# Patient Record
Sex: Female | Born: 1985
Health system: Southern US, Community
[De-identification: ages and names within clinical notes are randomized; demographics above are authoritative.]

## PROBLEM LIST (undated history)

## (undated) ENCOUNTER — Inpatient Hospital Stay (HOSPITAL_COMMUNITY): Payer: Self-pay

## (undated) ENCOUNTER — Emergency Department (HOSPITAL_COMMUNITY): Admission: EM | Payer: 59 | Source: Home / Self Care

## (undated) DIAGNOSIS — E0500 Thyrotoxicosis with diffuse goiter without thyrotoxic crisis or storm: Secondary | ICD-10-CM

## (undated) DIAGNOSIS — E079 Disorder of thyroid, unspecified: Secondary | ICD-10-CM

## (undated) DIAGNOSIS — O1490 Unspecified pre-eclampsia, unspecified trimester: Secondary | ICD-10-CM

## (undated) DIAGNOSIS — J301 Allergic rhinitis due to pollen: Secondary | ICD-10-CM

## (undated) DIAGNOSIS — D649 Anemia, unspecified: Secondary | ICD-10-CM

## (undated) HISTORY — PX: THYROIDECTOMY: SHX17

## (undated) HISTORY — DX: Unspecified pre-eclampsia, unspecified trimester: O14.90

## (undated) HISTORY — DX: Anemia, unspecified: D64.9

## (undated) HISTORY — PX: THERAPEUTIC ABORTION: SHX798

---

## 2011-02-26 DIAGNOSIS — E039 Hypothyroidism, unspecified: Secondary | ICD-10-CM | POA: Insufficient documentation

## 2011-04-23 DIAGNOSIS — L689 Hypertrichosis, unspecified: Secondary | ICD-10-CM | POA: Insufficient documentation

## 2015-01-01 ENCOUNTER — Encounter (HOSPITAL_BASED_OUTPATIENT_CLINIC_OR_DEPARTMENT_OTHER): Payer: Self-pay

## 2015-01-01 ENCOUNTER — Emergency Department (HOSPITAL_BASED_OUTPATIENT_CLINIC_OR_DEPARTMENT_OTHER)
Admission: EM | Admit: 2015-01-01 | Discharge: 2015-01-01 | Disposition: A | Discharge status: Home / Self Care | Payer: Commercial Managed Care - HMO | Attending: Emergency Medicine | Admitting: Emergency Medicine

## 2015-01-01 DIAGNOSIS — Z3202 Encounter for pregnancy test, result negative: Secondary | ICD-10-CM | POA: Diagnosis not present

## 2015-01-01 DIAGNOSIS — R197 Diarrhea, unspecified: Secondary | ICD-10-CM | POA: Diagnosis present

## 2015-01-01 DIAGNOSIS — Z79899 Other long term (current) drug therapy: Secondary | ICD-10-CM | POA: Diagnosis not present

## 2015-01-01 DIAGNOSIS — E079 Disorder of thyroid, unspecified: Secondary | ICD-10-CM | POA: Insufficient documentation

## 2015-01-01 DIAGNOSIS — N3090 Cystitis, unspecified without hematuria: Secondary | ICD-10-CM

## 2015-01-01 HISTORY — DX: Disorder of thyroid, unspecified: E07.9

## 2015-01-01 LAB — URINALYSIS, ROUTINE W REFLEX MICROSCOPIC
Bilirubin Urine: NEGATIVE
Glucose, UA: NEGATIVE mg/dL
Hgb urine dipstick: NEGATIVE
KETONES UR: NEGATIVE mg/dL
Nitrite: NEGATIVE
PROTEIN: NEGATIVE mg/dL
Specific Gravity, Urine: 1.005 (ref 1.005–1.030)
UROBILINOGEN UA: 1 mg/dL (ref 0.0–1.0)
pH: 6.5 (ref 5.0–8.0)

## 2015-01-01 LAB — PREGNANCY, URINE: PREG TEST UR: NEGATIVE

## 2015-01-01 LAB — URINE MICROSCOPIC-ADD ON

## 2015-01-01 MED ORDER — NITROFURANTOIN MONOHYD MACRO 100 MG PO CAPS: 100.0000 mg | ORAL_CAPSULE | Freq: Two times a day (BID) | ORAL | Status: DC

## 2015-01-01 NOTE — ED Notes (Signed)
Pt presents eating and drinking in triage.

## 2015-01-01 NOTE — Discharge Instructions (Signed)
Diarrhea °Diarrhea is frequent loose and watery bowel movements. It can cause you to feel weak and dehydrated. Dehydration can cause you to become tired and thirsty, have a dry mouth, and have decreased urination that often is dark yellow. Diarrhea is a sign of another problem, most often an infection that will not last long. In most cases, diarrhea typically lasts 2-3 days. However, it can last longer if it is a sign of something more serious. It is important to treat your diarrhea as directed by your caregiver to lessen or prevent future episodes of diarrhea. °CAUSES  °Some common causes include: °· Gastrointestinal infections caused by viruses, bacteria, or parasites. °· Food poisoning or food allergies. °· Certain medicines, such as antibiotics, chemotherapy, and laxatives. °· Artificial sweeteners and fructose. °· Digestive disorders. °HOME CARE INSTRUCTIONS °· Ensure adequate fluid intake (hydration): Have 1 cup (8 oz) of fluid for each diarrhea episode. Avoid fluids that contain simple sugars or sports drinks, fruit juices, whole milk products, and sodas. Your urine should be clear or pale yellow if you are drinking enough fluids. Hydrate with an oral rehydration solution that you can purchase at pharmacies, retail stores, and online. You can prepare an oral rehydration solution at home by mixing the following ingredients together: °¨  - tsp table salt. °¨ ¾ tsp baking soda. °¨  tsp salt substitute containing potassium chloride. °¨ 1  tablespoons sugar. °¨ 1 L (34 oz) of water. °· Certain foods and beverages may increase the speed at which food moves through the gastrointestinal (GI) tract. These foods and beverages should be avoided and include: °¨ Caffeinated and alcoholic beverages. °¨ High-fiber foods, such as raw fruits and vegetables, nuts, seeds, and whole grain breads and cereals. °¨ Foods and beverages sweetened with sugar alcohols, such as xylitol, sorbitol, and mannitol. °· Some foods may be well  tolerated and may help thicken stool including: °¨ Starchy foods, such as rice, toast, pasta, low-sugar cereal, oatmeal, grits, baked potatoes, crackers, and bagels. °¨ Bananas. °¨ Applesauce. °· Add probiotic-rich foods to help increase healthy bacteria in the GI tract, such as yogurt and fermented milk products. °· Wash your hands well after each diarrhea episode. °· Only take over-the-counter or prescription medicines as directed by your caregiver. °· Take a warm bath to relieve any burning or pain from frequent diarrhea episodes. °SEEK IMMEDIATE MEDICAL CARE IF:  °· You are unable to keep fluids down. °· You have persistent vomiting. °· You have blood in your stool, or your stools are black and tarry. °· You do not urinate in 6-8 hours, or there is only a small amount of very dark urine. °· You have abdominal pain that increases or localizes. °· You have weakness, dizziness, confusion, or light-headedness. °· You have a severe headache. °· Your diarrhea gets worse or does not get better. °· You have a fever or persistent symptoms for more than 2-3 days. °· You have a fever and your symptoms suddenly get worse. °MAKE SURE YOU:  °· Understand these instructions. °· Will watch your condition. °· Will get help right away if you are not doing well or get worse. °Document Released: 04/17/2002 Document Revised: 09/11/2013 Document Reviewed: 01/03/2012 °ExitCare® Patient Information ©2015 [employer]. This information is not intended to replace advice given to you by your health care provider. Make sure you discuss any questions you have with your health care provider. °Urinary Tract Infection °Urinary tract infections (UTIs) can develop anywhere along your urinary tract. Your urinary tract   is your body's drainage system for removing wastes and extra water. Your urinary tract includes two kidneys, two ureters, a bladder, and a urethra. Your kidneys are a pair of bean-shaped organs. Each kidney is about the size of  your fist. They are located below your ribs, one on each side of your spine. °CAUSES °Infections are caused by microbes, which are microscopic organisms, including fungi, viruses, and bacteria. These organisms are so small that they can only be seen through a microscope. Bacteria are the microbes that most commonly cause UTIs. °SYMPTOMS  °Symptoms of UTIs may vary by age and gender of the patient and by the location of the infection. Symptoms in young women typically include a frequent and intense urge to urinate and a painful, burning feeling in the bladder or urethra during urination. Older women and men are more likely to be tired, shaky, and weak and have muscle aches and abdominal pain. A fever may mean the infection is in your kidneys. Other symptoms of a kidney infection include pain in your back or sides below the ribs, nausea, and vomiting. °DIAGNOSIS °To diagnose a UTI, your caregiver will ask you about your symptoms. Your caregiver also will ask to provide a urine sample. The urine sample will be tested for bacteria and white blood cells. White blood cells are made by your body to help fight infection. °TREATMENT  °Typically, UTIs can be treated with medication. Because most UTIs are caused by a bacterial infection, they usually can be treated with the use of antibiotics. The choice of antibiotic and length of treatment depend on your symptoms and the type of bacteria causing your infection. °HOME CARE INSTRUCTIONS °· If you were prescribed antibiotics, take them exactly as your caregiver instructs you. Finish the medication even if you feel better after you have only taken some of the medication. °· Drink enough water and fluids to keep your urine clear or pale yellow. °· Avoid caffeine, tea, and carbonated beverages. They tend to irritate your bladder. °· Empty your bladder often. Avoid holding urine for long periods of time. °· Empty your bladder before and after sexual intercourse. °· After a bowel  movement, women should cleanse from front to back. Use each tissue only once. °SEEK MEDICAL CARE IF:  °· You have back pain. °· You develop a fever. °· Your symptoms do not begin to resolve within 3 days. °SEEK IMMEDIATE MEDICAL CARE IF:  °· You have severe back pain or lower abdominal pain. °· You develop chills. °· You have nausea or vomiting. °· You have continued burning or discomfort with urination. °MAKE SURE YOU:  °· Understand these instructions. °· Will watch your condition. °· Will get help right away if you are not doing well or get worse. °Document Released: 02/04/2005 Document Revised: 10/27/2011 Document Reviewed: 06/05/2011 °ExitCare® Patient Information ©2015 [employer]. This information is not intended to replace advice given to you by your health care provider. Make sure you discuss any questions you have with your health care provider. ° °

## 2015-01-01 NOTE — ED Notes (Signed)
Pt presents with numerous complaints: 1. Diarrhea since Friday with 2-3 episodes per day. 2. Nipple soreness. 3. Neck and back tenderness. 4. Intermittent fever with chills. 5. Dark colored urine.

## 2015-01-02 NOTE — ED Provider Notes (Signed)
CSN: [id number]     Arrival date & time 01/01/15  1042 History   First MD Initiated Contact with Patient 01/01/15 1046     Chief Complaint  Patient presents with  . Diarrhea     (Consider location/radiation/quality/duration/timing/severity/associated sxs/prior Treatment) Patient is a 29 y.o. female presenting with diarrhea.  Diarrhea Quality:  Watery Severity:  Moderate Onset quality:  Gradual Duration:  4 days Timing:  Intermittent Progression:  Unchanged Relieved by:  Nothing Worsened by:  Nothing tried Associated symptoms: chills   Associated symptoms: no abdominal pain, no diaphoresis, no fever and no vomiting   Risk factors: no recent antibiotic use, no sick contacts and no suspicious food intake     Past Medical History  Diagnosis Date  . Thyroid disease    History reviewed. No pertinent past surgical history. No family history on file. Social History  Substance Use Topics  . Smoking status: Never Smoker   . Smokeless tobacco: None  . Alcohol Use: None   OB History    No data available     Review of Systems  Constitutional: Positive for chills. Negative for fever and diaphoresis.  Gastrointestinal: Positive for diarrhea. Negative for vomiting and abdominal pain.  All other systems reviewed and are negative.     Allergies  Review of patient's allergies indicates no known allergies.  Home Medications   Prior to Admission medications   Medication Sig Start Date End Date Taking? Authorizing Provider  levothyroxine (SYNTHROID, LEVOTHROID) 100 MCG tablet Take 100 mcg by mouth daily before breakfast.   Yes Historical Provider, MD  nitrofurantoin, macrocrystal-monohydrate, (MACROBID) 100 MG capsule Take 1 capsule (100 mg total) by mouth 2 (two) times daily. X 7 days 01/01/15   Mirian Mo, MD   BP 135/82 mmHg  Pulse 108  Temp(Src) 100.9 F (38.3 C) (Oral)  Resp 18  Ht 5' (1.524 m)  Wt 154 lb (69.854 kg)  BMI 30.08 kg/m2  SpO2 99%  LMP  12/19/2014 Physical Exam  Constitutional: She is oriented to person, place, and time. She appears well-developed and well-nourished.  HENT:  Head: Normocephalic and atraumatic.  Right Ear: External ear normal.  Left Ear: External ear normal.  Eyes: Conjunctivae and EOM are normal. Pupils are equal, round, and reactive to light.  Neck: Normal range of motion. Neck supple.  Cardiovascular: Normal rate, regular rhythm, normal heart sounds and intact distal pulses.   Pulmonary/Chest: Effort normal and breath sounds normal.  Abdominal: Soft. Bowel sounds are normal. There is no tenderness.  Musculoskeletal: Normal range of motion.  Neurological: She is alert and oriented to person, place, and time.  Skin: Skin is warm and dry.  Vitals reviewed.   ED Course  Procedures (including critical care time) Labs Review Labs Reviewed  URINALYSIS, ROUTINE W REFLEX MICROSCOPIC (NOT AT [facility]) - Abnormal; Notable for the following:    APPearance CLOUDY (*)    Leukocytes, UA MODERATE (*)    All other components within normal limits  URINE MICROSCOPIC-ADD ON - Abnormal; Notable for the following:    Bacteria, UA MANY (*)    All other components within normal limits  PREGNANCY, URINE    Imaging Review No results found. I have personally reviewed and evaluated these images and lab results as part of my medical decision-making.   EKG Interpretation None      MDM   Final diagnoses:  Cystitis  Diarrhea    29 y.o. female without pertinent PMH presents with diarrhea and dark urine over the  past 4 days. No fevers at home, however here febrile to 100.9. On arrival vital signs and physical exam as above. Physical exam benign. Workup revealed signs of urinary tract infection. Discharged home with Macrobid. No abdominal pain or upper GI symptoms.    I have reviewed all laboratory and imaging studies if ordered as above  1. Cystitis   2. Diarrhea         Mirian Mo, MD 01/02/15 [phone]

## 2015-01-04 ENCOUNTER — Ambulatory Visit: Payer: Self-pay | Admitting: Internal Medicine

## 2015-02-05 ENCOUNTER — Ambulatory Visit (INDEPENDENT_AMBULATORY_CARE_PROVIDER_SITE_OTHER): Payer: Commercial Managed Care - HMO | Admitting: Internal Medicine

## 2015-02-05 ENCOUNTER — Encounter: Payer: Self-pay | Admitting: Internal Medicine

## 2015-02-05 VITALS — BP 128/62 | HR 81 | Temp 98.4°F | Resp 12 | Ht 61.0 in | Wt 152.4 lb

## 2015-02-05 DIAGNOSIS — E890 Postprocedural hypothyroidism: Secondary | ICD-10-CM | POA: Diagnosis not present

## 2015-02-05 DIAGNOSIS — L680 Hirsutism: Secondary | ICD-10-CM

## 2015-02-05 DIAGNOSIS — E349 Endocrine disorder, unspecified: Secondary | ICD-10-CM

## 2015-02-05 DIAGNOSIS — R7989 Other specified abnormal findings of blood chemistry: Secondary | ICD-10-CM

## 2015-02-05 LAB — T4, FREE: FREE T4: 1.62 ng/dL — AB (ref 0.60–1.60)

## 2015-02-05 LAB — TSH: TSH: 0.3 u[IU]/mL — AB (ref 0.35–4.50)

## 2015-02-05 NOTE — Progress Notes (Signed)
Patient ID: Carol Kelley, female   DOB: April 22, 1986, 29 y.o.   MRN: [id number]   HPI  Carol Kelley is a 29 y.o.-year-old female, referred by Dr. Linda Hedges, for management of postsurgical hypothyroidism. She would also want [place] to address her high testosterone level and hirsutism.  Pt. has been dx with Graves ds at 29 y/o >> tried po tx's and then RAI tx >> did not work >> thyroidectomy at 29 y/o; initially on Synthroid is on Levothyroxine 175 >> 200 mcg (09/2014), taken: - fasting - with water - separated by 4h from b'fast  - no calcium, iron, PPIs, multivitamins   I reviewed pt's thyroid tests: 08/28/2014: TSH 26.18, free T4 0.91  Pt describes: - no weight gain - no fatigue - no cold intolerance - no depression - no constipation - no dry skin - no hair loss - + regular menses  Pt denies feeling nodules in neck, hoarseness, dysphagia/odynophagia, SOB with lying down.  She has no FH of thyroid disorders. No FH of thyroid cancer.  No h/o radiation tx to head or neck, other RAI tx. No recent use of iodine supplements.  No h/o clotting ds or BrCA in her family. Has migraines.  She c/o acne - chin - started in highschool. She c/o hirsutism: chin, lower abd. - started in highschool. She was on OCPs in the past >> does not remember formulation >> acne and hair were better controlled. No infertility. She had a pregnancy in 2011.   I reviewed last received by OB/GYN , drawn on 08/28/2014: -  Total testosterone 75 (10-70),  Free testosterone 6.9 (0.6-6.8), SHBG 88 (17-124) -  CMP normal -  DHEAS 152 (18-391)  ROS: Constitutional: no weight gain/loss, no fatigue, no subjective hyperthermia/hypothermia Eyes: no blurry vision, no xerophthalmia ENT: no sore throat, no nodules palpated in throat, no dysphagia/odynophagia, no hoarseness Cardiovascular: no CP/SOB/palpitations/leg swelling Respiratory: no cough/SOB Gastrointestinal: no N/V/D/C Musculoskeletal: no muscle/joint  aches Skin: no rashes, + excessive hair growth - chin, lower abd. Neurological: no tremors/numbness/tingling/dizziness, + HA Psychiatric: no depression/+ anxiety  Past Medical History  Diagnosis Date  . Thyroid disease    No past surgical history on file. Social History   Social History  . Marital Status: Single    Spouse Name: N/A  . Number of Children: 1   Occupational History  .  Research scientist (physical sciences)   Social History Main Topics  . Smoking status: Current Some Day Smoker  . Smokeless tobacco: Not on file  . Alcohol Use: 0.0 oz/week    0 Standard drinks or equivalent per week  . Drug Use: No   Current Outpatient Prescriptions on File Prior to Visit  Medication Sig Dispense Refill  . nitrofurantoin, macrocrystal-monohydrate, (MACROBID) 100 MG capsule Take 1 capsule (100 mg total) by mouth 2 (two) times daily. X 7 days (Patient not taking: Reported on 02/05/2015) 14 capsule 0   No current facility-administered medications on file prior to visit.   No Known Allergies   FH: - hypertension in mother  PE: BP 128/62 mmHg  Pulse 81  Temp(Src) 98.4 F (36.9 C) (Oral)  Resp 12  Ht $R'5\' 1"'ys$  (1.549 m)  Wt 152 lb 6.4 oz (69.128 kg)  BMI 28.81 kg/m2  SpO2 99% LMP 01/20/2015 Wt Readings from Last 3 Encounters:  02/05/15 152 lb 6.4 oz (69.128 kg)  01/01/15 154 lb (69.854 kg)   Constitutional: overweight, in NAD Eyes: PERRLA, EOMI, no exophthalmos ENT: moist mucous membranes, no  Neck Masses,  cervical scar healed, no cervical lymphadenopathy Cardiovascular: RRR, No MRG Respiratory: CTA B Gastrointestinal: abdomen soft, NT, ND, BS+ Musculoskeletal: no deformities, strength intact in all 4 Skin: moist, warm, no rashes; + acne and dark hair shafts on chin and also dark hair on linea alba Neurological: no tremor with outstretched hands, DTR normal in all 4  ASSESSMENT: 1. Hypothyroidism -  Postsurgical, after total thyroidectomy for Graves' disease  2.  Elevated  testosterone and hirsutism - ?  PCOS  PLAN:  1. Patient with long-standing hypothyroidism, on levothyroxine therapy. She has had fluctuating thyroid tests in the past, and she admits that these her likely caused by her missing levothyroxine doses. Since last check, she mentions that she took it every day. She appears euthyroid.  - continue LT4 200 mcg daily for now - We discussed about correct intake of levothyroxine, fasting, with water, separated by at least 30 minutes from breakfast, and separated by more than 4 hours from calcium, iron, multivitamins, acid reflux medications (PPIs). I strongly advised her to take it every day, and put the bottle on the nightstand to make sure that she doesn't skip. - will check thyroid tests today: TSH, free T4 - If these are abnormal, she will need to return in 6-8 weeks for repeat labs - If these are normal, I will see her back in  6 months  2.  Elevated testosterone and hirsutism -  Patient has  Regular menstrual cycles, however she complains of acne and hirsutism , which were present since high school, but exacerbated in the last year. No significant weight changes. -  I reviewed previous labs obtained by OB/GYN in 08/2014.   The free testosterone is mildly elevated.  The DHEAS is normal. -  I will recheck the testosterone today, along with other PCOS -related labs.  I advised her to come back for these in the third day of her menstrual cycle, as now she is close to ovulation. -  I discussedwith the patient about the fact that the PCOS is a misnomer, a patient does not necessarily have to have polycystic ovaries to be diagnosed with the disorder. This is of sum of several conditions, including:  weight gain  insulin resistance (and therefore a higher risk of developing diabetes later in life)  acne  hirsutism  irregular menstrual cycles  decreased fertility. - We also discussed about the fact that the treatment is usually targeted to addressing  the problem that concerns the patient the most: acne/hirsutism, weight gain, or fertility, but there is no single treatment for PCOS.  - The first-line therapy are oral contraceptives. If she is concerned with her weight, we can use metformin; if she is concerned about acne/hirsutism, we can add spironolactone; and if she is concerned about fertility, I could refer her to reproductive endocrinology for possible use of clomiphene. -  I did suggest to start back on oral contraception, since this is the best way to help with acne and hirsutism.  She is contemplating another pregnancy, but not right now.  I am a little worried about her migraines, but she denies that these are associated with aura. I will await the results of the hormonal tests and then sent Ortho-Cyclen to her pharmacy, but I did advise her to let me know if anything changes in the character of her migraines or if she has the more often. I also advised her to continue to follow-up with OB/GYN.  Orders Placed This Encounter  Procedures  . T4,  free  . TSH  . Follicle stimulating hormone  . Luteinizing hormone  . Prolactin  . Testosterone, Free, Total, SHBG  . Estradiol  . Beta HCG, Quant (tumor marker)  . 17-Hydroxyprogesterone   - time spent with the patient: 1 hour, of which >50% was spent in obtaining information about her  Hypothyroidism  And possible PCOS, reviewing her previous labs, evaluations, and treatments, counseling her about her condition (please see the discussed topics above), and developing a plan to further investigate it; she had a number of questions which I addressed.  Office Visit on 02/05/2015  Component Date Value Ref Range Status  . Free T4 02/05/2015 1.62* 0.60 - 1.60 ng/dL Final  . TSH 02/05/2015 0.30* 0.35 - 4.50 uIU/mL Final    The thyroid tests are abnormal, which would be consistent with the patient not taking a high dose of levothyroxine consistently. I will advise her to decrease the dose of  levothyroxine to 175 g daily. We'll need to recheck the thyroid tests in 6 weeks.  I will addend the rest of the results when they become available.

## 2015-02-05 NOTE — Patient Instructions (Addendum)
Please stop at the lab.  Please check the following website: [email or web address]  Please come back for labs in the 3rd day of your next menstrual cycle.  Take the thyroid hormone every day, with water, >30 minutes before breakfast, separated by >4 hours from acid reflux medications, calcium, iron, multivitamins.  Please return in 6 months with your sugar log.

## 2015-02-06 DIAGNOSIS — R7989 Other specified abnormal findings of blood chemistry: Secondary | ICD-10-CM | POA: Insufficient documentation

## 2015-02-06 DIAGNOSIS — L680 Hirsutism: Secondary | ICD-10-CM | POA: Insufficient documentation

## 2015-02-06 MED ORDER — LEVOTHYROXINE SODIUM 175 MCG PO TABS
175.0000 ug | ORAL_TABLET | Freq: Every day | ORAL | Status: DC
Start: 2015-02-06 — End: 2015-07-09

## 2015-07-09 ENCOUNTER — Other Ambulatory Visit: Payer: Self-pay | Admitting: Internal Medicine

## 2015-08-05 ENCOUNTER — Ambulatory Visit: Payer: Commercial Managed Care - HMO | Admitting: Internal Medicine

## 2015-10-07 ENCOUNTER — Other Ambulatory Visit: Payer: Self-pay | Admitting: Internal Medicine

## 2016-03-17 ENCOUNTER — Other Ambulatory Visit: Payer: Self-pay | Admitting: Internal Medicine

## 2016-03-20 ENCOUNTER — Ambulatory Visit: Payer: Commercial Managed Care - HMO | Admitting: Internal Medicine

## 2016-04-30 ENCOUNTER — Emergency Department (HOSPITAL_BASED_OUTPATIENT_CLINIC_OR_DEPARTMENT_OTHER): Payer: Commercial Managed Care - HMO

## 2016-04-30 ENCOUNTER — Emergency Department (HOSPITAL_BASED_OUTPATIENT_CLINIC_OR_DEPARTMENT_OTHER)
Admission: EM | Admit: 2016-04-30 | Discharge: 2016-04-30 | Disposition: A | Discharge status: Home / Self Care | Payer: Commercial Managed Care - HMO | Attending: Emergency Medicine | Admitting: Emergency Medicine

## 2016-04-30 ENCOUNTER — Encounter (HOSPITAL_BASED_OUTPATIENT_CLINIC_OR_DEPARTMENT_OTHER): Payer: Self-pay

## 2016-04-30 DIAGNOSIS — Z87891 Personal history of nicotine dependence: Secondary | ICD-10-CM | POA: Insufficient documentation

## 2016-04-30 DIAGNOSIS — R059 Cough, unspecified: Secondary | ICD-10-CM

## 2016-04-30 DIAGNOSIS — R05 Cough: Secondary | ICD-10-CM | POA: Insufficient documentation

## 2016-04-30 DIAGNOSIS — R61 Generalized hyperhidrosis: Secondary | ICD-10-CM | POA: Insufficient documentation

## 2016-04-30 HISTORY — DX: Thyrotoxicosis with diffuse goiter without thyrotoxic crisis or storm: E05.00

## 2016-04-30 MED ORDER — BENZONATATE 100 MG PO CAPS: 100.0000 mg | ORAL_CAPSULE | Freq: Three times a day (TID) | ORAL | 0 refills | Status: DC

## 2016-04-30 MED FILL — BENZONATATE 100 MG CAPSULE: 100 | 7 days supply | Qty: 21 | Fill #0

## 2016-04-30 NOTE — Discharge Instructions (Signed)
Take the prescribed medication as directed. Follow-up with your primary care doctor for any ongoing issues. Return to the ED for new or worsening symptoms.

## 2016-04-30 NOTE — ED Provider Notes (Signed)
[facility] Provider Note   CSN: [id number] Arrival date & time: 04/30/16  1313     History   Chief Complaint Chief Complaint  Patient presents with  . Cough    HPI Carol Kelley is a 30 y.o. female.  The history is provided by the patient and medical records.  Cough     30 year old female with history of thyroid disease, here with cough. Reports this began 2 days ago. Cough is productive with thick, yellow mucus. She reports associated chills and sweating at night. She's not had any known sick contacts. She denies any history of or exposure to TB. She is not been out of the country recently. She did try taking some over-the-counter Robitussin for cough but this gave her diarrhea. She denies any abdominal pain. No chest pain or shortness of breath.  Past Medical History:  Diagnosis Date  . Graves disease   . Thyroid disease     Patient Active Problem List   Diagnosis Date Noted  . Elevated testosterone level in female 02/06/2015  . Hirsutism 02/06/2015  . Postsurgical hypothyroidism 02/05/2015    Past Surgical History:  Procedure Laterality Date  . CESAREAN SECTION    . THYROIDECTOMY      OB History    No data available       Home Medications    Prior to Admission medications   Medication Sig Start Date End Date Taking? Authorizing Provider  levothyroxine (SYNTHROID, LEVOTHROID) 175 MCG tablet TAKE 1 TABLET (175 MCG TOTAL) BY MOUTH DAILY BEFORE BREAKFAST. **PT NEEDS LAB APPT** 03/17/16   Carlus Pavlovristina Gherghe, MD    Family History No family history on file.  Social History Social History  Substance Use Topics  . Smoking status: Former Games developermoker  . Smokeless tobacco: Never Used  . Alcohol use 0.0 oz/week     Comment: occ     Allergies   Patient has no known allergies.   Review of Systems Review of Systems  Constitutional: Positive for diaphoresis (nights).  Respiratory: Positive for cough.   All other systems reviewed and are  negative.    Physical Exam Updated Vital Signs BP 113/80 (BP Location: Right Leg)   Pulse 86   Temp 98.3 F (36.8 C) (Oral)   Resp 18   Ht 5' (1.524 m)   Wt 69.3 kg   LMP 04/29/2016   SpO2 100%   BMI 29.84 kg/m   Physical Exam  Constitutional: She is oriented to person, place, and time. She appears well-developed and well-nourished.  HENT:  Head: Normocephalic and atraumatic.  Right Ear: Hearing and ear canal normal.  Left Ear: Hearing and ear canal normal.  Nose: Nose normal.  Mouth/Throat: Uvula is midline, oropharynx is clear and moist and mucous membranes are normal.  Fluid in both ears, no signs of infection, TMs intact, mastoids non-tender, no significant nasal congestion, tonsils normal in appearance bilaterally,  Eyes: Conjunctivae and EOM are normal. Pupils are equal, round, and reactive to light.  Neck: Normal range of motion.  Cardiovascular: Normal rate, regular rhythm and normal heart sounds.   Pulmonary/Chest: Effort normal and breath sounds normal. She has no wheezes. She has no rhonchi.  Lungs clear, no wheezes or rhonchi  Abdominal: Soft. Bowel sounds are normal.  Musculoskeletal: Normal range of motion.  Neurological: She is alert and oriented to person, place, and time.  Skin: Skin is warm and dry.  Psychiatric: She has a normal mood and affect.  Nursing note and vitals reviewed.  ED Treatments / Results  Labs (all labs ordered are listed, but only abnormal results are displayed) Labs Reviewed - No data to display  EKG  EKG Interpretation None       Radiology Dg Chest 2 View  Result Date: 04/30/2016 CLINICAL DATA:  Productive cough and night sweats for 1 week. EXAM: CHEST  2 VIEW COMPARISON:  None. FINDINGS: The heart size and mediastinal contours are within normal limits. Both lungs are clear. The visualized skeletal structures are unremarkable. IMPRESSION: Negative.  No active cardiopulmonary disease. Electronically Signed   By: Myles RosenthalJohn   Stahl M.D.   On: 04/30/2016 13:50    Procedures Procedures (including critical care time)  Medications Ordered in ED Medications - No data to display   Initial Impression / Assessment and Plan / ED Course  I have reviewed the triage vital signs and the nursing notes.  Pertinent labs & imaging results that were available during my care of the patient were reviewed by me and considered in my medical decision making (see chart for details).  Clinical Course    30 year old female here with cough. Reports sweating at night. Here she is afebrile and nontoxic. Her exam is overall noninfectious. Chest x-ray obtained, no acute findings. No prior diagnosis or exposure to TB and I have low suspicion for such. Likely viral URI. Will be discharged home with symptomatic care.  Discussed plan with patient, she acknowledged understanding and agreed with plan of care.  Return precautions given for new or worsening symptoms.  Final Clinical Impressions(s) / ED Diagnoses   Final diagnoses:  Cough    New Prescriptions Discharge Medication List as of 04/30/2016  2:25 PM    START taking these medications   Details  benzonatate (TESSALON) 100 MG capsule Take 1 capsule (100 mg total) by mouth every 8 (eight) hours., Starting Thu 04/30/2016, Print         Garlon HatchetLisa M Jairus Tonne, PA-C 04/30/16 1513    Benjiman CoreNathan Pickering, MD 04/30/16 [phone]

## 2016-04-30 NOTE — ED Triage Notes (Signed)
Cough x 3-4 days-NAD-steady gait

## 2016-05-08 ENCOUNTER — Ambulatory Visit (INDEPENDENT_AMBULATORY_CARE_PROVIDER_SITE_OTHER): Payer: Commercial Managed Care - HMO | Admitting: Internal Medicine

## 2016-05-08 VITALS — BP 120/76 | HR 66 | Temp 98.3°F | Resp 16 | Ht 60.0 in | Wt 155.2 lb

## 2016-05-08 DIAGNOSIS — E890 Postprocedural hypothyroidism: Secondary | ICD-10-CM | POA: Diagnosis not present

## 2016-05-08 DIAGNOSIS — R7989 Other specified abnormal findings of blood chemistry: Secondary | ICD-10-CM

## 2016-05-08 DIAGNOSIS — E349 Endocrine disorder, unspecified: Secondary | ICD-10-CM | POA: Diagnosis not present

## 2016-05-08 LAB — T4, FREE: FREE T4: 1.44 ng/dL (ref 0.60–1.60)

## 2016-05-08 LAB — TSH: TSH: 0.31 u[IU]/mL — ABNORMAL LOW (ref 0.35–4.50)

## 2016-05-08 NOTE — Progress Notes (Signed)
Patient ID: Carol Kelley, female   DOB: [birth year], 30 y.o.   MRN: [id number]   HPI  Carol Kelley is a 30 y.o.-year-old female, initially referred by Dr. Linda Hedges, for management of postsurgical hypothyroidism. She would also want [place] to address her high testosterone level and hirsutism. Last visit 01/2015, after which patient was lost for follow-up.  She tells me that she cannot easily take time off from work given to come for labs.  Postsurgical hypothyroidism: Reviewed and addended history: Pt. has been dx with Graves ds at 30 y/o >> tried po tx's and then RAI tx >> did not work >> thyroidectomy at 30 y/o; initially on Synthroid is on Levothyroxine 175 >> 200 >> 175 mcg (01/2015).  She is taking the levothyroxine: - fasting - with water - separated by 30 from b'fast  - no calcium, iron, PPIs - multivitamins at lunchtime - started 6 mo ago  I reviewed pt's thyroid tests: Lab Results  Component Value Date   TSH 0.30 (L) 02/05/2015   FREET4 1.62 (H) 02/05/2015  08/28/2014: TSH 26.18, free T4 0.91  Pt describes: - no weight gain - no fatigue - no cold intolerance - no depression - no constipation - no dry skin - no hair loss - + regular menses  Pt denies feeling nodules in neck, hoarseness, dysphagia/odynophagia, SOB with lying down.  She has no FH of thyroid disorders. No FH of thyroid cancer.  No h/o radiation tx to head or neck, other RAI tx. No recent use of iodine supplements.  No h/o clotting ds or BrCA in her family. Has migraines.   She also had labs and a phenotype consistent with PCOS at last visit, and I advised her to return for a more complete investigation, but she did not return for this. - + acne on chin - started in highschool. - + hirsutism on chin, lower abd. - started in highschool. She was on OCPs in the past >> acne and hair were better controlled. However, she would not want to be on OCPs anymore and would like to follow a more natural  approach. No infertility. She had a pregnancy in 2011.   Labs received by OB/GYN (08/28/2014) - Dr. Linda Hedges: -  Total testosterone 75 (10-70),  Free testosterone 6.9 (0.6-6.8), SHBG 88 (17-124) -  CMP normal -  DHEAS 152 (18-391)  She had a repeat testosterone in 10/2015 >> will get records.  She started MVI >> she feels much better. They also help with dysmenorrhea.  ROS: Constitutional: no weight gain/loss, no fatigue, no subjective hyperthermia/hypothermia Eyes: no blurry vision, no xerophthalmia ENT: no sore throat, no nodules palpated in throat, no dysphagia/odynophagia, no hoarseness Cardiovascular: no CP/SOB/palpitations/leg swelling Respiratory: + cough/no SOB Gastrointestinal: no N/V/D/C Musculoskeletal: no muscle/joint aches Skin: no rashes, + excessive hair growth - chin, lower abd. Neurological: no tremors/numbness/tingling/dizziness, + HA  I reviewed pt's medications, allergies, PMH, social hx, family hx, and changes were documented in the history of present illness. Otherwise, unchanged from my initial visit note.  Past Medical History:  Diagnosis Date  . Graves disease   . Thyroid disease    Past Surgical History:  Procedure Laterality Date  . CESAREAN SECTION    . THYROIDECTOMY     Social History   Social History  . Marital Status: Single    Spouse Name: N/A  . Number of Children: 1   Occupational History  .  Research scientist (physical sciences)   Social History Main Topics  .  Smoking status: Current Some Day Smoker  . Smokeless tobacco: Not on file  . Alcohol Use: 0.0 oz/week    0 Standard drinks or equivalent per week  . Drug Use: No   Current Outpatient Prescriptions on File Prior to Visit  Medication Sig Dispense Refill  . benzonatate (TESSALON) 100 MG capsule Take 1 capsule (100 mg total) by mouth every 8 (eight) hours. 21 capsule 0  . levothyroxine (SYNTHROID, LEVOTHROID) 175 MCG tablet TAKE 1 TABLET (175 MCG TOTAL) BY MOUTH DAILY BEFORE  BREAKFAST. **PT NEEDS LAB APPT** 45 tablet 1   No current facility-administered medications on file prior to visit.    No Known Allergies   FH: - hypertension in mother  PE: BP 120/76   Pulse 66   Temp 98.3 F (36.8 C)   Resp 16   Ht 5' (1.524 m)   Wt 155 lb 3.2 oz (70.4 kg)   LMP 04/29/2016   BMI 30.31 kg/m   Wt Readings from Last 3 Encounters:  05/08/16 155 lb 3.2 oz (70.4 kg)  04/30/16 152 lb 12.8 oz (69.3 kg)  02/05/15 152 lb 6.4 oz (69.1 kg)   Constitutional: overweight, in NAD Eyes: PERRLA, EOMI, no exophthalmos ENT: moist mucous membranes, no neck Masses, cervical scar healed, no cervical lymphadenopathy Cardiovascular: RRR, No MRG Respiratory: CTA B Gastrointestinal: abdomen soft, NT, ND, BS+ Musculoskeletal: no deformities, strength intact in all 4 Skin: moist, warm, no rashes; + acne and dark hair shafts on chin and also dark hair on linea alba Neurological: no tremor with outstretched hands, DTR normal in all 4  ASSESSMENT: 1. Postsurgical Hypothyroidism - after total thyroidectomy for Graves' disease  2.  Elevated testosterone and hirsutism - Most likely PCOS  PLAN:  1. Patient with long-standing hypothyroidism, on levothyroxine therapy. She has had fluctuating thyroid tests in the past, and she admitted for missing levothyroxine doses in the past, but none now. At last visit, her TSH was suppressed, and we decrease her levothyroxine from 200 to 175 g daily. She appears euthyroid.  - continue LT4 175 mcg daily for now, but I advised her that we will need to check labs in 5-6 weeks if we end up changing her current levothyroxine dose - We discussed about correct intake of levothyroxine, fasting, with water, separated by at least 30 minutes from breakfast, and separated by more than 4 hours from calcium, iron, multivitamins, acid reflux medications (PPIs). She is taking it correctly. - will check thyroid tests today: TSH, free T4 - If these are abnormal, she  will need to return in 6-8 weeks for repeat labs - If these are normal, I will see her back in one year, per her preference  2.  Elevated testosterone and hirsutism -  Patient has regular menstrual cycles, with more dysmenorrhea lately (improved on multivitamins), but does have acne and hirsutism, which were present since high school. No significant weight changes. At last visit, we discussed at length about PCOS syndrome, since this is highly probable for her, and I advised her to come back in the third day of her menstrual cycle for a more complete workup. She did not return and she mentions that this is almost impossible for her, since she cannot take time from work. She does tell me that her OB/GYN doctor, Dr. Lynnette Caffey, checked her testosterone again this summer and this was elevated. Patient signed a release of information form and will obtain these labs. Today is her 9th day since LMP, so I will  not check a testosterone level, but will check a prolactin and 17 hydroxyprogesterone. If these are normal, she most likely has PCOS. - We also discussed about the fact that the treatment is usually targeted to addressing the problem that concerns the patient the most, which is her: acne/hirsutism. These got better on oral contraceptives, however, she would not want to restart and prefers a more natural approach. We discussed about the new list studies with resveratrol, and antioxidant, which showed great results in terms of decreased testosterone levels and insulin resistance. She would like to try this >> recommended to start trans-resveratrol 1500 mg daily.  Needs refills.  Component     Latest Ref Rng & Units 05/08/2016  T4,Free(Direct)     0.60 - 1.60 ng/dL 1.44  TSH     0.35 - 4.50 uIU/mL 0.31 (L)  Prolactin     ng/mL 15.0  17-OH-Progesterone, LC/MS/MS      ng/dL 21   Prolactin and 17 hydroxyprogesterone levels are normal, therefore, the most likely diagnosis is PCOS. Since she would like to  avoid OCPs, will start trans-resveratrol.  TSH is still low, therefore, will decrease the levothyroxine dose to 150 g daily. We'll need a repeat set of labs in 2 months.  Philemon Kingdom, MD PhD [facility]

## 2016-05-08 NOTE — Patient Instructions (Addendum)
Please stop at the lab.  Please try Trans-Resveratrol 1500 mg daily daily.  Continue Levothyroxine 175 mcg daily.  Take the thyroid hormone every day, with water, at least 30 minutes before breakfast, separated by at least 4 hours from: - acid reflux medications - calcium - iron - multivitamins  Please return in 1 years.

## 2016-05-09 LAB — PROLACTIN: Prolactin: 15 ng/mL

## 2016-05-12 ENCOUNTER — Encounter: Payer: Self-pay | Admitting: Internal Medicine

## 2016-05-12 LAB — 17-HYDROXYPROGESTERONE: 17-OH-Progesterone, LC/MS/MS: 21 ng/dL

## 2016-05-12 MED ORDER — LEVOTHYROXINE SODIUM 150 MCG PO TABS: 150.0000 ug | ORAL_TABLET | Freq: Every day | ORAL | 1 refills | Status: DC

## 2016-05-13 ENCOUNTER — Telehealth: Payer: Self-pay

## 2016-05-13 NOTE — Telephone Encounter (Signed)
Left message for patient to call back for lab results. Gave call back number.

## 2016-05-13 NOTE — Telephone Encounter (Signed)
Patient returned phone call, discussed lab work and medication changes, and dosages. Patient made lab appointment for 2 months as requested.

## 2016-07-17 ENCOUNTER — Other Ambulatory Visit: Payer: Commercial Managed Care - HMO

## 2016-10-09 ENCOUNTER — Other Ambulatory Visit: Payer: Self-pay | Admitting: Internal Medicine

## 2017-03-16 ENCOUNTER — Other Ambulatory Visit: Payer: Self-pay

## 2017-03-16 MED ORDER — LEVOTHYROXINE SODIUM 150 MCG PO TABS: ORAL_TABLET | ORAL | 1 refills | Status: DC

## 2017-05-07 ENCOUNTER — Ambulatory Visit: Payer: Commercial Managed Care - HMO | Admitting: Internal Medicine

## 2017-09-11 ENCOUNTER — Other Ambulatory Visit: Payer: Self-pay | Admitting: Internal Medicine

## 2017-10-26 ENCOUNTER — Inpatient Hospital Stay (HOSPITAL_COMMUNITY)
Admission: AD | Admit: 2017-10-26 | Discharge: 2017-10-27 | Disposition: A | Discharge status: Home / Self Care | Payer: 59 | Source: Ambulatory Visit | Attending: Family Medicine | Admitting: Family Medicine

## 2017-10-26 ENCOUNTER — Other Ambulatory Visit: Payer: Self-pay

## 2017-10-26 ENCOUNTER — Encounter (HOSPITAL_COMMUNITY): Payer: Self-pay

## 2017-10-26 ENCOUNTER — Inpatient Hospital Stay (HOSPITAL_COMMUNITY): Payer: 59

## 2017-10-26 DIAGNOSIS — Z87891 Personal history of nicotine dependence: Secondary | ICD-10-CM | POA: Diagnosis not present

## 2017-10-26 DIAGNOSIS — R109 Unspecified abdominal pain: Secondary | ICD-10-CM

## 2017-10-26 DIAGNOSIS — B9689 Other specified bacterial agents as the cause of diseases classified elsewhere: Secondary | ICD-10-CM

## 2017-10-26 DIAGNOSIS — O26891 Other specified pregnancy related conditions, first trimester: Secondary | ICD-10-CM

## 2017-10-26 DIAGNOSIS — O2331 Infections of other parts of urinary tract in pregnancy, first trimester: Secondary | ICD-10-CM | POA: Diagnosis not present

## 2017-10-26 DIAGNOSIS — Z3A08 8 weeks gestation of pregnancy: Secondary | ICD-10-CM | POA: Diagnosis not present

## 2017-10-26 DIAGNOSIS — O209 Hemorrhage in early pregnancy, unspecified: Secondary | ICD-10-CM | POA: Diagnosis not present

## 2017-10-26 DIAGNOSIS — Z3491 Encounter for supervision of normal pregnancy, unspecified, first trimester: Secondary | ICD-10-CM

## 2017-10-26 DIAGNOSIS — N760 Acute vaginitis: Secondary | ICD-10-CM

## 2017-10-26 LAB — WET PREP, GENITAL
Sperm: NONE SEEN
Trich, Wet Prep: NONE SEEN
Yeast Wet Prep HPF POC: NONE SEEN

## 2017-10-26 LAB — URINALYSIS, ROUTINE W REFLEX MICROSCOPIC
BILIRUBIN URINE: NEGATIVE
Glucose, UA: NEGATIVE mg/dL
KETONES UR: NEGATIVE mg/dL
LEUKOCYTES UA: NEGATIVE
NITRITE: NEGATIVE
PROTEIN: NEGATIVE mg/dL
Specific Gravity, Urine: 1.011 (ref 1.005–1.030)
pH: 6 (ref 5.0–8.0)

## 2017-10-26 LAB — CBC
HCT: 29.5 % — ABNORMAL LOW (ref 36.0–46.0)
Hemoglobin: 10 g/dL — ABNORMAL LOW (ref 12.0–15.0)
MCH: 23.3 pg — AB (ref 26.0–34.0)
MCHC: 33.9 g/dL (ref 30.0–36.0)
MCV: 68.8 fL — ABNORMAL LOW (ref 78.0–100.0)
PLATELETS: 267 10*3/uL (ref 150–400)
RBC: 4.29 MIL/uL (ref 3.87–5.11)
RDW: 14.8 % (ref 11.5–15.5)
WBC: 6.7 10*3/uL (ref 4.0–10.5)

## 2017-10-26 LAB — HCG, QUANTITATIVE, PREGNANCY: HCG, BETA CHAIN, QUANT, S: 167285 m[IU]/mL — AB (ref <5)

## 2017-10-26 LAB — POCT PREGNANCY, URINE: Preg Test, Ur: POSITIVE — AB

## 2017-10-26 NOTE — MAU Note (Signed)
Pt states she is [redacted] weeks pregnant and has had lower abdominal cramping and spotting that started on Sunday. Pt reports and odor but denies discharge. LMP: 08/28/2017.

## 2017-10-26 NOTE — MAU Provider Note (Signed)
History     CSN: [id number]  Arrival date and time: 10/26/17 2137   First Provider Initiated Contact with Patient 10/26/17 2228      Chief Complaint  Patient presents with  . Vaginal Bleeding   HPI  Carol Kelley is a 32 y.o. G3P1011 at [redacted]w[redacted]d by LMP who presents with abdominal cramping and vaginal bleeding. Symptoms began on Sunday with intermittent lower abdominal cramping follow up vaginal bleeding. Rates pain 3/10. Has not treated symptoms. Nothing makes pain better or worse. Has noticed dark red/brown spotting on toilet paper and in her panty liner. Is not saturating pads or passing clots. No recent intercourse. Denies vaginal discharge or dysuria. States blood has strong odor to it.  Has not started prenatal care. Plans on going to [facility] but has not scheduled appointment with them.   OB History    Gravida  3   Para  1   Term  1   Preterm      AB  1   Living  1     SAB  1   TAB      Ectopic      Multiple      Live Births              Past Medical History:  Diagnosis Date  . Graves disease   . Thyroid disease     Past Surgical History:  Procedure Laterality Date  . CESAREAN SECTION    . THYROIDECTOMY      History reviewed. No pertinent family history.  Social History   Tobacco Use  . Smoking status: Former Games developer  . Smokeless tobacco: Never Used  Substance Use Topics  . Alcohol use: Yes    Alcohol/week: 0.0 oz    Comment: occ  . Drug use: Yes    Types: Marijuana    Comment: Last time 10/25/17    Allergies: No Known Allergies  Medications Prior to Admission  Medication Sig Dispense Refill Last Dose  . levothyroxine (SYNTHROID, LEVOTHROID) 150 MCG tablet TAKE 1 TABLET BY MOUTH EVERY DAY BEFORE BREAKFAST 60 tablet 0 10/26/2017 at Unknown time    Review of Systems  Constitutional: Negative.   Gastrointestinal: Positive for abdominal pain. Negative for nausea and vomiting.  Genitourinary: Positive for vaginal bleeding.  Negative for dysuria and vaginal discharge.   Physical Exam   Blood pressure 134/81, pulse 87, temperature 99.1 F (37.3 C), resp. rate 16, height 5' (1.524 m), weight 137 lb (62.1 kg), last menstrual period 08/28/2017, SpO2 100 %.  Physical Exam  Nursing note and vitals reviewed. Constitutional: She is oriented to person, place, and time. She appears well-developed and well-nourished. No distress.  HENT:  Head: Normocephalic and atraumatic.  Eyes: Conjunctivae are normal. Right eye exhibits no discharge. Left eye exhibits no discharge. No scleral icterus.  Neck: Normal range of motion.  Cardiovascular: Normal rate, regular rhythm and normal heart sounds.  No murmur heard. Respiratory: Effort normal and breath sounds normal. No respiratory distress. She has no wheezes.  GI: Soft. Bowel sounds are normal. She exhibits no distension. There is no tenderness. There is no rebound and no guarding.  Genitourinary: Uterus normal. Cervix exhibits no motion tenderness and no friability. Right adnexum displays no mass and no tenderness. Left adnexum displays no mass and no tenderness. There is bleeding (minimal amount of brown mucoid blood. No active bleeding. Foul odor noted. ) in the vagina. No vaginal discharge found.  Genitourinary Comments: Cervix closed  Neurological: She  is alert and oriented to person, place, and time.  Skin: Skin is warm and dry. She is not diaphoretic.  Psychiatric: She has a normal mood and affect. Her behavior is normal. Judgment and thought content normal.    MAU Course  Procedures Results for orders placed or performed during the hospital encounter of 10/26/17 (from the past 24 hour(s))  Urinalysis, Routine w reflex microscopic     Status: Abnormal   Collection Time: 10/26/17  9:55 PM  Result Value Ref Range   Color, Urine YELLOW YELLOW   APPearance CLEAR CLEAR   Specific Gravity, Urine 1.011 1.005 - 1.030   pH 6.0 5.0 - 8.0   Glucose, UA NEGATIVE NEGATIVE mg/dL    Hgb urine dipstick SMALL (A) NEGATIVE   Bilirubin Urine NEGATIVE NEGATIVE   Ketones, ur NEGATIVE NEGATIVE mg/dL   Protein, ur NEGATIVE NEGATIVE mg/dL   Nitrite NEGATIVE NEGATIVE   Leukocytes, UA NEGATIVE NEGATIVE   WBC, UA 0-5 0 - 5 WBC/hpf   Bacteria, UA RARE (A) NONE SEEN   Squamous Epithelial / LPF 0-5 0 - 5   Mucus PRESENT   Pregnancy, urine POC     Status: Abnormal   Collection Time: 10/26/17 10:03 PM  Result Value Ref Range   Preg Test, Ur POSITIVE (A) NEGATIVE  CBC     Status: Abnormal   Collection Time: 10/26/17 10:52 PM  Result Value Ref Range   WBC 6.7 4.0 - 10.5 K/uL   RBC 4.29 3.87 - 5.11 MIL/uL   Hemoglobin 10.0 (L) 12.0 - 15.0 g/dL   HCT 04.529.5 (L) 40.936.0 - 81.146.0 %   MCV 68.8 (L) 78.0 - 100.0 fL   MCH 23.3 (L) 26.0 - 34.0 pg   MCHC 33.9 30.0 - 36.0 g/dL   RDW 91.414.8 78.211.5 - 95.615.5 %   Platelets 267 150 - 400 K/uL  ABO/Rh     Status: None (Preliminary result)   Collection Time: 10/26/17 10:52 PM  Result Value Ref Range   ABO/RH(D)      O POS Performed at [facility], [address] [place], [place] [id number]   hCG, quantitative, pregnancy     Status: Abnormal   Collection Time: 10/26/17 10:52 PM  Result Value Ref Range   hCG, Beta Chain, Quant, S [phone] (H) <5 mIU/mL  Wet prep, genital     Status: Abnormal   Collection Time: 10/26/17 11:13 PM  Result Value Ref Range   Yeast Wet Prep HPF POC NONE SEEN NONE SEEN   Trich, Wet Prep NONE SEEN NONE SEEN   Clue Cells Wet Prep HPF POC PRESENT (A) NONE SEEN   WBC, Wet Prep HPF POC FEW (A) NONE SEEN   Sperm NONE SEEN    [place] Ob Comp Less 14 Wks  Result Date: 10/26/2017 CLINICAL DATA:  32 year old female with abdominal pain and vaginal bleeding in the 1st trimester of pregnancy. Positive urine pregnancy test, estimated gestational age by LMP 8 weeks 3 days. EXAM: OBSTETRIC <14 WK ULTRASOUND TECHNIQUE: Transabdominal ultrasound was performed for evaluation of the gestation as well as the maternal uterus and adnexal  regions. COMPARISON:  None. FINDINGS: Intrauterine gestational sac: Single Yolk sac:  Visible Embryo:  Visible Cardiac Activity: Detected Heart Rate: 164 bpm CRL:   18.6 mm   8 w 2 d                  US EDC: 06/05/2017 Subchorionic hemorrhage:  None visualized. Maternal uterus/adnexae: Normal left ovary measuring 2.4 x  2.6 x 1.6 centimeters. Normal right ovary measuring 2.5 x 2.5 x 1.6 centimeters. No pelvic free fluid. IMPRESSION: Single living IUP demonstrated. No acute maternal findings visualized. Electronically Signed   By: Odessa Fleming M.D.   On: 10/26/2017 23:58    MDM +UPT UA, wet prep, GC/chlamydia, CBC, ABO/Rh, quant hCG, HIV, and [place] today to rule out ectopic pregnancy O positive Ultrasound shows SIUP with cardiac activity Wet prep + clue cells Assessment and Plan  A: 1. Normal IUP (intrauterine pregnancy) on prenatal ultrasound, first trimester   2. Vaginal bleeding in pregnancy, first trimester   3. Abdominal pain during pregnancy in first trimester   4. BV (bacterial vaginosis)    p: Discharge home Rx flagyl GC/CT pending Start prenatal care Pelvic rest Discussed reasons to return to MAU  Judeth Horn 10/26/2017, 10:28 PM

## 2017-10-26 NOTE — MAU Note (Signed)
Pt. Reports spotting for two days and mild cramping. Denies discharge.

## 2017-10-27 LAB — ABO/RH: ABO/RH(D): O POS

## 2017-10-27 MED ORDER — METRONIDAZOLE 500 MG PO TABS: 500.0000 mg | ORAL_TABLET | Freq: Two times a day (BID) | ORAL | 0 refills | Status: DC

## 2017-10-27 NOTE — Discharge Instructions (Signed)
Vaginal Bleeding During Pregnancy, First Trimester A small amount of bleeding (spotting) from the vagina is relatively common in early pregnancy. It usually stops on its own. Various things may cause bleeding or spotting in early pregnancy. Some bleeding may be related to the pregnancy, and some may not. In most cases, the bleeding is normal and is not a problem. However, bleeding can also be a sign of something serious. Be sure to tell your health care provider about any vaginal bleeding right away. Some possible causes of vaginal bleeding during the first trimester include:  Infection or inflammation of the cervix.  Growths (polyps) on the cervix.  Miscarriage or threatened miscarriage.  Pregnancy tissue has developed outside of the uterus and in a fallopian tube (tubal pregnancy).  Tiny cysts have developed in the uterus instead of pregnancy tissue (molar pregnancy).  Follow these instructions at home: Watch your condition for any changes. The following actions may help to lessen any discomfort you are feeling:  Follow your health care provider's instructions for limiting your activity. If your health care provider orders bed rest, you may need to stay in bed and only get up to use the bathroom. However, your health care provider may allow you to continue light activity.  If needed, make plans for someone to help with your regular activities and responsibilities while you are on bed rest.  Keep track of the number of pads you use each day, how often you change pads, and how soaked (saturated) they are. Write this down.  Do not use tampons. Do not douche.  Do not have sexual intercourse or orgasms until approved by your health care provider.  If you pass any tissue from your vagina, save the tissue so you can show it to your health care provider.  Only take over-the-counter or prescription medicines as directed by your health care provider.  Do not take aspirin because it can make  you bleed.  Keep all follow-up appointments as directed by your health care provider.  Contact a health care provider if:  You have any vaginal bleeding during any part of your pregnancy.  You have cramps or labor pains.  You have a fever, not controlled by medicine. Get help right away if:  You have severe cramps in your back or belly (abdomen).  You pass large clots or tissue from your vagina.  Your bleeding increases.  You feel light-headed or weak, or you have fainting episodes.  You have chills.  You are leaking fluid or have a gush of fluid from your vagina.  You pass out while having a bowel movement. This information is not intended to replace advice given to you by your health care provider. Make sure you discuss any questions you have with your health care provider. Document Released: 02/04/2005 Document Revised: 10/03/2015 Document Reviewed: 01/02/2013 Elsevier Interactive Patient Education  2018 [employer]. Bacterial Vaginosis Bacterial vaginosis is a vaginal infection that occurs when the normal balance of bacteria in the vagina is disrupted. It results from an overgrowth of certain bacteria. This is the most common vaginal infection among women ages 72-44. Because bacterial vaginosis increases your risk for STIs (sexually transmitted infections), getting treated can help reduce your risk for chlamydia, gonorrhea, herpes, and HIV (human immunodeficiency virus). Treatment is also important for preventing complications in pregnant women, because this condition can cause an early (premature) delivery. What are the causes? This condition is caused by an increase in harmful bacteria that are normally present in small amounts  in the vagina. However, the reason that the condition develops is not fully understood. What increases the risk? The following factors may make you more likely to develop this condition:  Having a new sexual partner or multiple sexual  partners.  Having unprotected sex.  Douching.  Having an intrauterine device (IUD).  Smoking.  Drug and alcohol abuse.  Taking certain antibiotic medicines.  Being pregnant.  You cannot get bacterial vaginosis from toilet seats, bedding, swimming pools, or contact with objects around you. What are the signs or symptoms? Symptoms of this condition include:  Grey or white vaginal discharge. The discharge can also be watery or foamy.  A fish-like odor with discharge, especially after sexual intercourse or during menstruation.  Itching in and around the vagina.  Burning or pain with urination.  Some women with bacterial vaginosis have no signs or symptoms. How is this diagnosed? This condition is diagnosed based on:  Your medical history.  A physical exam of the vagina.  Testing a sample of vaginal fluid under a microscope to look for a large amount of bad bacteria or abnormal cells. Your health care provider may use a cotton swab or a small wooden spatula to collect the sample.  How is this treated? This condition is treated with antibiotics. These may be given as a pill, a vaginal cream, or a medicine that is put into the vagina (suppository). If the condition comes back after treatment, a second round of antibiotics may be needed. Follow these instructions at home: Medicines  Take over-the-counter and prescription medicines only as told by your health care provider.  Take or use your antibiotic as told by your health care provider. Do not stop taking or using the antibiotic even if you start to feel better. General instructions  If you have a female sexual partner, tell her that you have a vaginal infection. She should see her health care provider and be treated if she has symptoms. If you have a female sexual partner, he does not need treatment.  During treatment: ? Avoid sexual activity until you finish treatment. ? Do not douche. ? Avoid alcohol as directed by  your health care provider. ? Avoid breastfeeding as directed by your health care provider.  Drink enough water and fluids to keep your urine clear or pale yellow.  Keep the area around your vagina and rectum clean. ? Wash the area daily with warm water. ? Wipe yourself from front to back after using the toilet.  Keep all follow-up visits as told by your health care provider. This is important. How is this prevented?  Do not douche.  Wash the outside of your vagina with warm water only.  Use protection when having sex. This includes latex condoms and dental dams.  Limit how many sexual partners you have. To help prevent bacterial vaginosis, it is best to have sex with just one partner (monogamous).  Make sure you and your sexual partner are tested for STIs.  Wear cotton or cotton-lined underwear.  Avoid wearing tight pants and pantyhose, especially during summer.  Limit the amount of alcohol that you drink.  Do not use any products that contain nicotine or tobacco, such as cigarettes and e-cigarettes. If you need help quitting, ask your health care provider.  Do not use illegal drugs. Where to find more information:  Centers for Disease Control and Prevention: AppraiserFraud.fi  American Sexual Health Association (ASHA): www.ashastd.org  U.S. Department of Health and [employer], Office on Women's Health: DustingSprays.pl or  http://www.anderson-williamson.info/https://www.womenshealth.gov/a-z-topics/bacterial-vaginosis Contact a health care provider if:  Your symptoms do not improve, even after treatment.  You have more discharge or pain when urinating.  You have a fever.  You have pain in your abdomen.  You have pain during sex.  You have vaginal bleeding between periods. Summary  Bacterial vaginosis is a vaginal infection that occurs when the normal balance of bacteria in the vagina is disrupted.  Because bacterial vaginosis increases your risk for STIs (sexually transmitted infections),  getting treated can help reduce your risk for chlamydia, gonorrhea, herpes, and HIV (human immunodeficiency virus). Treatment is also important for preventing complications in pregnant women, because the condition can cause an early (premature) delivery.  This condition is treated with antibiotic medicines. These may be given as a pill, a vaginal cream, or a medicine that is put into the vagina (suppository). This information is not intended to replace advice given to you by your health care provider. Make sure you discuss any questions you have with your health care provider. Document Released: 04/27/2005 Document Revised: 08/31/2016 Document Reviewed: 01/11/2016 Elsevier Interactive Patient Education  [employer]2018 Elsevier Inc.

## 2017-10-28 LAB — GC/CHLAMYDIA PROBE AMP (~~LOC~~) NOT AT ARMC
Chlamydia: NEGATIVE
Neisseria Gonorrhea: NEGATIVE

## 2017-11-27 ENCOUNTER — Other Ambulatory Visit: Payer: Self-pay | Admitting: Internal Medicine

## 2017-12-21 ENCOUNTER — Other Ambulatory Visit: Payer: Self-pay | Admitting: Internal Medicine

## 2017-12-22 ENCOUNTER — Telehealth: Payer: Self-pay | Admitting: Internal Medicine

## 2017-12-22 MED ORDER — LEVOTHYROXINE SODIUM 150 MCG PO TABS: ORAL_TABLET | ORAL | 0 refills | Status: DC

## 2017-12-22 NOTE — Telephone Encounter (Signed)
levothyroxine (SYNTHROID, LEVOTHROID) 150 MCG tablet    Patient stated she has run out of medication and would like enough to last until her next lab appt.  Please advise      [id number] - [place], [place] - [address]

## 2017-12-22 NOTE — Telephone Encounter (Signed)
Pt needed an in person appointment has not been seen since 2017. Made appointment and filled prescription to last until her labs.

## 2017-12-30 ENCOUNTER — Other Ambulatory Visit (INDEPENDENT_AMBULATORY_CARE_PROVIDER_SITE_OTHER): Payer: 59

## 2017-12-30 ENCOUNTER — Other Ambulatory Visit: Payer: Self-pay | Admitting: Internal Medicine

## 2017-12-30 DIAGNOSIS — E890 Postprocedural hypothyroidism: Secondary | ICD-10-CM | POA: Diagnosis not present

## 2017-12-30 LAB — TSH: TSH: 3.52 u[IU]/mL (ref 0.35–4.50)

## 2017-12-30 LAB — T4, FREE: Free T4: 1.01 ng/dL (ref 0.60–1.60)

## 2017-12-31 ENCOUNTER — Telehealth: Payer: Self-pay

## 2017-12-31 DIAGNOSIS — E890 Postprocedural hypothyroidism: Secondary | ICD-10-CM

## 2017-12-31 NOTE — Telephone Encounter (Signed)
-----   Message from Carlus Pavlovristina Gherghe, MD sent at 12/30/2017  4:53 PM EDT ----- Toni Amendourtney, can you please call pt: TSH is a little higher that target, which is 2.5 for the first trimester. We need to increase her LT4 dose by 2 tablets a week >> she can take 2 on Mondays and Thursdays, for e.g. We will need to repeat the TSH and fT4 in 1 mo - can you please order these?

## 2018-02-07 ENCOUNTER — Other Ambulatory Visit: Payer: 59

## 2018-02-08 ENCOUNTER — Other Ambulatory Visit: Payer: Self-pay | Admitting: Internal Medicine

## 2018-03-08 ENCOUNTER — Other Ambulatory Visit: Payer: Self-pay | Admitting: Internal Medicine

## 2018-03-22 ENCOUNTER — Ambulatory Visit: Payer: 59 | Admitting: Internal Medicine

## 2018-03-22 DIAGNOSIS — Z0289 Encounter for other administrative examinations: Secondary | ICD-10-CM

## 2018-04-28 ENCOUNTER — Other Ambulatory Visit: Payer: Self-pay | Admitting: Internal Medicine

## 2018-05-05 DIAGNOSIS — N760 Acute vaginitis: Secondary | ICD-10-CM | POA: Diagnosis not present

## 2018-06-20 ENCOUNTER — Encounter: Payer: Self-pay | Admitting: Endocrinology

## 2018-06-20 ENCOUNTER — Ambulatory Visit: Payer: 59 | Admitting: Endocrinology

## 2018-06-20 VITALS — BP 124/78 | HR 70 | Temp 99.2°F | Ht 60.0 in | Wt 132.6 lb

## 2018-06-20 DIAGNOSIS — E890 Postprocedural hypothyroidism: Secondary | ICD-10-CM | POA: Diagnosis not present

## 2018-06-20 DIAGNOSIS — L680 Hirsutism: Secondary | ICD-10-CM | POA: Diagnosis not present

## 2018-06-20 DIAGNOSIS — R634 Abnormal weight loss: Secondary | ICD-10-CM | POA: Diagnosis not present

## 2018-06-20 DIAGNOSIS — D508 Other iron deficiency anemias: Secondary | ICD-10-CM

## 2018-06-20 DIAGNOSIS — Z131 Encounter for screening for diabetes mellitus: Secondary | ICD-10-CM

## 2018-06-20 LAB — IBC PANEL
IRON: 50 ug/dL (ref 42–145)
SATURATION RATIOS: 13.4 % — AB (ref 20.0–50.0)
TRANSFERRIN: 266 mg/dL (ref 212.0–360.0)

## 2018-06-20 LAB — CBC WITH DIFFERENTIAL/PLATELET
BASOS ABS: 0.1 10*3/uL (ref 0.0–0.1)
Basophils Relative: 1.4 % (ref 0.0–3.0)
EOS PCT: 2.1 % (ref 0.0–5.0)
Eosinophils Absolute: 0.1 10*3/uL (ref 0.0–0.7)
HCT: 36.6 % (ref 36.0–46.0)
HEMOGLOBIN: 11.6 g/dL — AB (ref 12.0–15.0)
Lymphocytes Relative: 35.7 % (ref 12.0–46.0)
Lymphs Abs: 1.7 10*3/uL (ref 0.7–4.0)
MCHC: 31.8 g/dL (ref 30.0–36.0)
MCV: 73.9 fl — ABNORMAL LOW (ref 78.0–100.0)
MONO ABS: 0.4 10*3/uL (ref 0.1–1.0)
Monocytes Relative: 8.7 % (ref 3.0–12.0)
Neutro Abs: 2.5 10*3/uL (ref 1.4–7.7)
Neutrophils Relative %: 52.1 % (ref 43.0–77.0)
Platelets: 256 10*3/uL (ref 150.0–400.0)
RBC: 4.95 Mil/uL (ref 3.87–5.11)
RDW: 16.3 % — ABNORMAL HIGH (ref 11.5–15.5)
WBC: 4.8 10*3/uL (ref 4.0–10.5)

## 2018-06-20 MED ORDER — LEVOTHYROXINE SODIUM 150 MCG PO TABS: ORAL_TABLET | ORAL | 1 refills | Status: DC

## 2018-06-20 MED ORDER — FERROUS FUM-IRON POLYSACCH-FA 162-115.2-1 MG PO CAPS: 1.0000 | ORAL_CAPSULE | Freq: Every day | ORAL | 1 refills | Status: DC

## 2018-06-20 NOTE — Progress Notes (Signed)
Patient ID: Carol Kelley, female   DOB: January 06, 1986, 33 y.o.   MRN: [id number]             Reason for Appointment:  Hypothyroidism, new visit   History of Present Illness:   Hypothyroidism was first diagnosed  at age 89  At the time of diagnosis patient had thoracotomy for Graves' disease   If she has been irregular with her thyroid supplement she starts having symptoms of  fatigue She tends to have long-term cold sensitivity which is somewhat worse now         The patient has been treated with  doses of levothyroxine between 125 and 175 mcg and more recently has been taking 150 mcg  She has not been seen for her thyroid for over 2 years  The patient has been told to take the thyroid supplement before breakfast but she is frequently irregular with this and may take this later in the day when she remembers and occasionally may forget also She did not take her thyroid pill yesterday  Her thyroid level as of 8/19 showed TSH of 3.5 but not clear how compliant she was with her supplement at that time She did have a diagnosed pregnancy in the ER visit in 6/19 but could not continue this subsequently         Patient's weight history is as follows:  Wt Readings from Last 3 Encounters:  06/20/18 132 lb 9.6 oz (60.1 kg)  10/26/17 137 lb (62.1 kg)  05/08/16 155 lb 3.2 oz (70.4 kg)    Thyroid function results have been as follows:  Lab Results  Component Value Date   TSH 3.52 12/30/2017   TSH 0.31 (L) 05/08/2016   TSH 0.30 (L) 02/05/2015   FREET4 1.01 12/30/2017   FREET4 1.44 05/08/2016   FREET4 1.62 (H) 02/05/2015     Past Medical History:  Diagnosis Date  . Anemia   . Graves disease   . Thyroid disease     Past Surgical History:  Procedure Laterality Date  . CESAREAN SECTION    . THYROIDECTOMY      Family History  Problem Relation Age of Onset  . Hypertension Mother   . Diabetes Maternal Grandfather   . Diabetes Paternal Grandfather   . Thyroid disease Other      Social History:  reports that she has quit smoking. She has never used smokeless tobacco. She reports current alcohol use. She reports current drug use. Drug: Marijuana.  Allergies:  Allergies  Allergen Reactions  . Lac Bovis Nausea And Vomiting    stomach upset  . Pollen Extract Rash    Allergies as of 06/20/2018      Reactions   Lac Bovis Nausea And Vomiting   stomach upset   Pollen Extract Rash      Medication List       Accurate as of June 20, 2018  3:14 PM. Always use your most recent med list.        Ferrous Fum-Iron Polysacch-FA 162-115.2-1 MG Caps Take 1 capsule by mouth daily with breakfast.   levothyroxine 150 MCG tablet Commonly known as:  SYNTHROID, LEVOTHROID 1 tab qd          Review of Systems  Constitutional: Positive for weight loss.  HENT: Negative for trouble swallowing.   Eyes: Negative for visual disturbance.  Respiratory: Negative for shortness of breath.   Gastrointestinal: Negative for diarrhea.  Endocrine: Positive for cold intolerance. Negative for menstrual changes.  Musculoskeletal: Negative  for joint pain.  Skin:       No hair loss, no dry skin  Neurological: Negative for weakness.  Psychiatric/Behavioral: Positive for depressed mood and nervousness.    No history of hypertension  BP Readings from Last 3 Encounters:  06/20/18 124/78  10/27/17 134/86  05/08/16 120/76   She apparently has had chronic anemia and usually cannot take iron supplements because of constipation         Examination:    BP 124/78   Pulse 70   Temp 99.2 F (37.3 C) (Oral)   Ht 5' (1.524 m)   Wt 132 lb 9.6 oz (60.1 kg)   LMP 08/28/2017   SpO2 100%   BMI 25.90 kg/m   GENERAL:  Average build.   No pallor.    Skin:  no rash or significant skin lesions.  Has mild residual acne lesions on her lower face and minimal hirsutism seen on face No hirsutism on trunk area  EYES:  No prominence of the eyes or swelling of the eyelids  ENT:  Oral mucosa and tongue normal.  NECK: No lymphadenopathy  THYROID:  Not palpable.  HEART:  Normal  S1 and S2; no murmur or click.  CHEST:    Lungs: Vescicular breath sounds heard equally.  No crepitations/ wheeze.  ABDOMEN:  No distention.  Liver and spleen not palpable.  No other mass or tenderness.  NEUROLOGICAL: No tremor present.  Reflexes are normal at ankles.  EXTREMITIES:  Normal peripheral joints.  No ankle edema present   Assessment:  HYPOTHYROIDISM, postsurgical since age 58  She has been on levothyroxine with somewhat inconsistent thyroid levels Currently on 150 mcg daily Some of her variability may be related to inconsistent compliance with taking her medication, she tends to forget to take it before breakfast and occasionally may skip the medication completely Difficult to know whether she is symptomatic or not as she is having some issues with depression and anxiety as well as cold intolerance from her iron deficiency anemia  ANEMIA: She is not being followed regularly for this and is intolerant to iron, reportedly has iron deficiency  HIRSUTISM: This is chronic but reportedly getting worse.  She needs confirmation previous diagnosis of PCOS with a free testosterone level  Weight loss is likely to be from anxiety and depression and needs to establish with a PCP   PLAN:   Since she has not been regular with her thyroid supplements she needs to take this consistently for now For convenience and better compliance she can try to take it about 4 hours after either lunch or dinner whichever is more convenient without any food She will continue the same dose for the next 6 weeks  We will need to confirm that she is still iron deficient Will recommend tandem which is iron polysaccharide as she may have less constipation with this  She needs to establish with a PCP  We will check fasting free testosterone level on the next visit also  Follow-up   Reather Littler 06/20/2018, 3:14 PM   Consultation note copy sent to the PCP  Note: This office note was prepared with Dragon voice recognition system technology. Any transcriptional errors that result from this process are unintentional.

## 2018-06-28 ENCOUNTER — Inpatient Hospital Stay (HOSPITAL_COMMUNITY)
Admission: AD | Admit: 2018-06-28 | Discharge: 2018-06-28 | Disposition: A | Discharge status: Home / Self Care | Payer: 59 | Attending: Obstetrics & Gynecology | Admitting: Obstetrics & Gynecology

## 2018-06-28 ENCOUNTER — Encounter (HOSPITAL_COMMUNITY): Payer: Self-pay | Admitting: *Deleted

## 2018-06-28 DIAGNOSIS — N898 Other specified noninflammatory disorders of vagina: Secondary | ICD-10-CM | POA: Diagnosis present

## 2018-06-28 DIAGNOSIS — R300 Dysuria: Secondary | ICD-10-CM | POA: Insufficient documentation

## 2018-06-28 DIAGNOSIS — N926 Irregular menstruation, unspecified: Secondary | ICD-10-CM | POA: Diagnosis not present

## 2018-06-28 DIAGNOSIS — Z87891 Personal history of nicotine dependence: Secondary | ICD-10-CM | POA: Diagnosis not present

## 2018-06-28 LAB — WET PREP, GENITAL
CLUE CELLS WET PREP: NONE SEEN
Sperm: NONE SEEN
TRICH WET PREP: NONE SEEN
Yeast Wet Prep HPF POC: NONE SEEN

## 2018-06-28 LAB — URINALYSIS, ROUTINE W REFLEX MICROSCOPIC
BILIRUBIN URINE: NEGATIVE
Glucose, UA: NEGATIVE mg/dL
Hgb urine dipstick: NEGATIVE
KETONES UR: NEGATIVE mg/dL
LEUKOCYTE UA: NEGATIVE
NITRITE: NEGATIVE
Protein, ur: NEGATIVE mg/dL
Specific Gravity, Urine: 1.01 (ref 1.005–1.030)
pH: 7 (ref 5.0–8.0)

## 2018-06-28 LAB — POCT PREGNANCY, URINE: PREG TEST UR: NEGATIVE

## 2018-06-28 NOTE — MAU Note (Signed)
PT SAYS SHE  HAS DONE 3 HPT- NEG.  LMP 05-21-2018.  SAYS CYCLE  IS ALWAYS ON TIME.   FEELS MILD CRAMPS   IN BACK AND  SIDES.Marland Kitchen   ALSO  HAS VAG IRRITATION - WITH OTC MEDS BUT  HURTS  TO VOID

## 2018-06-28 NOTE — MAU Provider Note (Signed)
Chief Complaint:  Back Pain   Seen at 2155 by provider   HPI: Carol Kelley is a 33 y.o. [id number] who presents to maternity admissions reporting vaginal irritation and burning with urination..  Also states period is late by a week.  Does not want to be pregnant but not using birth control. Has some pelvic cramps.  . She reports vaginal bleeding, vaginal itching/burning, urinary symptoms, h/a, dizziness, n/v, or fever/chills.    HPI RN note: PT SAYS SHE  HAS DONE 3 HPT- NEG.  LMP 05-21-2018.  SAYS CYCLE  IS ALWAYS ON TIME.   FEELS MILD CRAMPS   IN BACK AND  SIDES.Marland Kitchen   ALSO  HAS VAG IRRITATION - WITH OTC MEDS BUT  HURTS  TO VOID   Past Medical History: Past Medical History:  Diagnosis Date  . Anemia   . Graves disease   . Thyroid disease     Past obstetric history: OB History  Gravida Para Term Preterm AB Living  3 1 1   2 1   SAB TAB Ectopic Multiple Live Births  1 1     1     # Outcome Date GA Lbr Len/2nd Weight Sex Delivery Anes PTL Lv  3 SAB  [redacted]w[redacted]d         2 Term      CS-Classical     1 TAB             Past Surgical History: Past Surgical History:  Procedure Laterality Date  . CESAREAN SECTION    . THYROIDECTOMY      Family History: Family History  Problem Relation Age of Onset  . Hypertension Mother   . Diabetes Maternal Grandfather   . Diabetes Paternal Grandfather   . Thyroid disease Other     Social History: Social History   Tobacco Use  . Smoking status: Former Games developer  . Smokeless tobacco: Never Used  Substance Use Topics  . Alcohol use: Yes    Alcohol/week: 0.0 standard drinks    Comment: occ  . Drug use: Yes    Types: Marijuana    Comment: Last time 10/25/17    Allergies:  Allergies  Allergen Reactions  . Lac Bovis Nausea And Vomiting    stomach upset  . Pollen Extract Rash    Meds:  Medications Prior to Admission  Medication Sig Dispense Refill Last Dose  . Ferrous Fum-Iron Polysacch-FA 162-115.2-1 MG CAPS Take 1 capsule by mouth daily  with breakfast. 30 each 1   . levothyroxine (SYNTHROID, LEVOTHROID) 150 MCG tablet 1 tab qd 30 tablet 1     I have reviewed patient's Past Medical Hx, Surgical Hx, Family Hx, Social Hx, medications and allergies.  ROS:  Review of Systems  Constitutional: Negative for chills, fatigue and fever.  Respiratory: Negative for shortness of breath.   Cardiovascular: Negative for leg swelling.  Gastrointestinal: Negative for abdominal pain, constipation, diarrhea and nausea.  Genitourinary: Positive for dysuria, menstrual problem and vaginal pain (irritation). Negative for frequency, pelvic pain and vaginal bleeding.   Other systems negative     Physical Exam   Patient Vitals for the past 24 hrs:  BP Temp Temp src Pulse Resp Height Weight  06/28/18 2137 132/89 98.5 F (36.9 C) Oral 62 18 5' (1.524 m) 61.1 kg   Constitutional: Well-developed, well-nourished female in no acute distress.  Cardiovascular: normal rate and rhythm Respiratory: normal effort, no distress.  GI: Abd soft, non-tender.  Nondistended.  No rebound, No guarding.   MS:  Extremities nontender, no edema, normal ROM Neurologic: Alert and oriented x 4.   Grossly nonfocal. GU: Neg CVAT. Skin:  Warm and Dry Psych:  Affect appropriate.  PELVIC EXAM: swabs sent, declines exam.    Labs: Results for orders placed or performed during the hospital encounter of 06/28/18 (from the past 24 hour(s))  Wet prep, genital     Status: Abnormal   Collection Time: 06/28/18  9:42 PM  Result Value Ref Range   Yeast Wet Prep HPF POC NONE SEEN NONE SEEN   Trich, Wet Prep NONE SEEN NONE SEEN   Clue Cells Wet Prep HPF POC NONE SEEN NONE SEEN   WBC, Wet Prep HPF POC MODERATE (A) NONE SEEN   Sperm NONE SEEN   Urinalysis, Routine w reflex microscopic     Status: None   Collection Time: 06/28/18  9:51 PM  Result Value Ref Range   Color, Urine YELLOW YELLOW   APPearance CLEAR CLEAR   Specific Gravity, Urine 1.010 1.005 - 1.030   pH 7.0  5.0 - 8.0   Glucose, UA NEGATIVE NEGATIVE mg/dL   Hgb urine dipstick NEGATIVE NEGATIVE   Bilirubin Urine NEGATIVE NEGATIVE   Ketones, ur NEGATIVE NEGATIVE mg/dL   Protein, ur NEGATIVE NEGATIVE mg/dL   Nitrite NEGATIVE NEGATIVE   Leukocytes,Ua NEGATIVE NEGATIVE  Pregnancy, urine POC     Status: None   Collection Time: 06/28/18  9:54 PM  Result Value Ref Range   Preg Test, Ur NEGATIVE NEGATIVE   --/--/O POS Performed at [facility], [address], [place], [place] [address]  (06/18 2252)  Imaging:  No results found.  MAU Course/MDM: I have ordered labs as follows: see above. Reviewed normal findings with patient.  Imaging ordered: none  Discussed dysuria could be related to caffeine or carbonation.  Recommend good hydration with water.   Reviewed cycles late can be related to abnormal ovulation. Recommend retake UPT if no menses next week.     Pt stable at time of discharge.  Assessment: Dysuria - Plan: Discharge patient  Late menses - Plan: Discharge patient   Plan: Discharge home Recommend monitor cycles, UPT next week if no menses Push PO fluids, avoid caffeine and soda  Encouraged to return here or to other Urgent Care/ED if she develops worsening of symptoms, increase in pain, fever, or other concerning symptoms.   Wynelle Bourgeois CNM, MSN Certified Nurse-Midwife 06/28/2018 10:48 PM

## 2018-06-28 NOTE — Discharge Instructions (Signed)

## 2018-06-30 LAB — GC/CHLAMYDIA PROBE AMP (~~LOC~~) NOT AT ARMC
Chlamydia: NEGATIVE
NEISSERIA GONORRHEA: NEGATIVE

## 2018-07-15 ENCOUNTER — Other Ambulatory Visit: Payer: Self-pay | Admitting: Endocrinology

## 2018-07-20 ENCOUNTER — Encounter (HOSPITAL_BASED_OUTPATIENT_CLINIC_OR_DEPARTMENT_OTHER): Payer: Self-pay

## 2018-07-20 ENCOUNTER — Emergency Department (HOSPITAL_BASED_OUTPATIENT_CLINIC_OR_DEPARTMENT_OTHER)
Admission: EM | Admit: 2018-07-20 | Discharge: 2018-07-20 | Disposition: A | Discharge status: Home / Self Care | Payer: 59 | Attending: Emergency Medicine | Admitting: Emergency Medicine

## 2018-07-20 ENCOUNTER — Emergency Department (HOSPITAL_BASED_OUTPATIENT_CLINIC_OR_DEPARTMENT_OTHER): Payer: 59

## 2018-07-20 ENCOUNTER — Other Ambulatory Visit: Payer: Self-pay

## 2018-07-20 DIAGNOSIS — R58 Hemorrhage, not elsewhere classified: Secondary | ICD-10-CM | POA: Insufficient documentation

## 2018-07-20 DIAGNOSIS — Z79899 Other long term (current) drug therapy: Secondary | ICD-10-CM | POA: Diagnosis not present

## 2018-07-20 DIAGNOSIS — M79604 Pain in right leg: Secondary | ICD-10-CM

## 2018-07-20 DIAGNOSIS — E890 Postprocedural hypothyroidism: Secondary | ICD-10-CM | POA: Insufficient documentation

## 2018-07-20 DIAGNOSIS — M545 Low back pain: Secondary | ICD-10-CM | POA: Diagnosis not present

## 2018-07-20 DIAGNOSIS — M79661 Pain in right lower leg: Secondary | ICD-10-CM | POA: Diagnosis not present

## 2018-07-20 DIAGNOSIS — M549 Dorsalgia, unspecified: Secondary | ICD-10-CM

## 2018-07-20 DIAGNOSIS — R0789 Other chest pain: Secondary | ICD-10-CM | POA: Diagnosis not present

## 2018-07-20 DIAGNOSIS — Z87891 Personal history of nicotine dependence: Secondary | ICD-10-CM | POA: Insufficient documentation

## 2018-07-20 HISTORY — DX: Allergic rhinitis due to pollen: J30.1

## 2018-07-20 LAB — CBC WITH DIFFERENTIAL/PLATELET
Abs Immature Granulocytes: 0.02 10*3/uL (ref 0.00–0.07)
Basophils Absolute: 0 10*3/uL (ref 0.0–0.1)
Basophils Relative: 1 %
Eosinophils Absolute: 0 10*3/uL (ref 0.0–0.5)
Eosinophils Relative: 1 %
HCT: 33.8 % — ABNORMAL LOW (ref 36.0–46.0)
Hemoglobin: 10.7 g/dL — ABNORMAL LOW (ref 12.0–15.0)
Immature Granulocytes: 0 %
Lymphocytes Relative: 31 %
Lymphs Abs: 1.4 10*3/uL (ref 0.7–4.0)
MCH: 23.5 pg — AB (ref 26.0–34.0)
MCHC: 31.7 g/dL (ref 30.0–36.0)
MCV: 74.1 fL — AB (ref 80.0–100.0)
Monocytes Absolute: 0.4 10*3/uL (ref 0.1–1.0)
Monocytes Relative: 8 %
Neutro Abs: 2.7 10*3/uL (ref 1.7–7.7)
Neutrophils Relative %: 59 %
Platelets: 249 10*3/uL (ref 150–400)
RBC: 4.56 MIL/uL (ref 3.87–5.11)
RDW: 15.2 % (ref 11.5–15.5)
WBC: 4.6 10*3/uL (ref 4.0–10.5)
nRBC: 0 % (ref 0.0–0.2)

## 2018-07-20 LAB — COMPREHENSIVE METABOLIC PANEL
ALT: 13 U/L (ref 0–44)
AST: 19 U/L (ref 15–41)
Albumin: 3.9 g/dL (ref 3.5–5.0)
Alkaline Phosphatase: 54 U/L (ref 38–126)
Anion gap: 4 — ABNORMAL LOW (ref 5–15)
BILIRUBIN TOTAL: 0.4 mg/dL (ref 0.3–1.2)
BUN: 8 mg/dL (ref 6–20)
CO2: 25 mmol/L (ref 22–32)
Calcium: 8.7 mg/dL — ABNORMAL LOW (ref 8.9–10.3)
Chloride: 106 mmol/L (ref 98–111)
Creatinine, Ser: 0.79 mg/dL (ref 0.44–1.00)
GFR calc Af Amer: >60 mL/min (ref >60)
GFR calc non Af Amer: >60 mL/min (ref >60)
Glucose, Bld: 97 mg/dL (ref 70–99)
Potassium: 3.9 mmol/L (ref 3.5–5.1)
Sodium: 135 mmol/L (ref 135–145)
Total Protein: 7 g/dL (ref 6.5–8.1)

## 2018-07-20 LAB — TROPONIN I: Troponin I: <0.03 ng/mL (ref <0.03)

## 2018-07-20 MED ORDER — CYCLOBENZAPRINE HCL 10 MG PO TABS: 10.0000 mg | ORAL_TABLET | Freq: Two times a day (BID) | ORAL | 0 refills | Status: DC | PRN
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
## 2018-07-20 NOTE — ED Notes (Signed)
EDP notified of BP 
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
## 2018-07-26 ENCOUNTER — Other Ambulatory Visit: Payer: 59

## 2018-07-28 ENCOUNTER — Other Ambulatory Visit: Payer: Self-pay | Admitting: Endocrinology

## 2018-08-01 ENCOUNTER — Ambulatory Visit: Payer: 59 | Admitting: Endocrinology

## 2018-08-02 ENCOUNTER — Other Ambulatory Visit: Payer: 59

## 2018-08-03 ENCOUNTER — Other Ambulatory Visit: Payer: Self-pay

## 2018-08-03 ENCOUNTER — Other Ambulatory Visit (INDEPENDENT_AMBULATORY_CARE_PROVIDER_SITE_OTHER): Payer: 59

## 2018-08-03 DIAGNOSIS — Z131 Encounter for screening for diabetes mellitus: Secondary | ICD-10-CM | POA: Diagnosis not present

## 2018-08-03 DIAGNOSIS — D508 Other iron deficiency anemias: Secondary | ICD-10-CM

## 2018-08-03 DIAGNOSIS — E890 Postprocedural hypothyroidism: Secondary | ICD-10-CM

## 2018-08-03 DIAGNOSIS — L680 Hirsutism: Secondary | ICD-10-CM

## 2018-08-03 LAB — CBC
HCT: 35.8 % — ABNORMAL LOW (ref 36.0–46.0)
HEMOGLOBIN: 11.4 g/dL — AB (ref 12.0–15.0)
MCHC: 31.8 g/dL (ref 30.0–36.0)
MCV: 73.4 fl — ABNORMAL LOW (ref 78.0–100.0)
Platelets: 294 10*3/uL (ref 150.0–400.0)
RBC: 4.88 Mil/uL (ref 3.87–5.11)
RDW: 14.6 % (ref 11.5–15.5)
WBC: 3.9 10*3/uL — ABNORMAL LOW (ref 4.0–10.5)

## 2018-08-03 LAB — TSH: TSH: 11.68 u[IU]/mL — ABNORMAL HIGH (ref 0.35–4.50)

## 2018-08-03 LAB — T4, FREE: Free T4: 0.87 ng/dL (ref 0.60–1.60)

## 2018-08-03 LAB — GLUCOSE, RANDOM: Glucose, Bld: 70 mg/dL (ref 70–99)

## 2018-08-04 LAB — TESTOSTERONE, FREE, TOTAL, SHBG
Sex Hormone Binding: 115.8 nmol/L (ref 24.6–122.0)
Testosterone, Free: 1.1 pg/mL (ref 0.0–4.2)
Testosterone: 22 ng/dL (ref 8–48)

## 2018-08-08 NOTE — Progress Notes (Signed)
Patient ID: Carol Kelley, female   DOB: Aug 30, 1985, 33 y.o.   MRN: [id number]             Reason for Appointment: Endocrinology follow-up   Today's office visit was provided via telemedicine using video technique . Consent for the patient has been obtained . Location of the patient: Home . Location of the provider: Office Only the patient and myself were participating in the encounter     History of Present Illness:   PROBLEM 1: Hypothyroidism was first diagnosed  at age 32  At the time of diagnosis patient had thyroidectomy for Graves' disease   If she is irregular with her thyroid supplement she starts having symptoms of  fatigue She tends to have long-term cold sensitivity also      The patient has been previously treated with  doses of levothyroxine between 125 and 175 mcg   RECENT history:  She has been taking 150 mcg levothyroxine dose  On her last visit in 2/20 her labs were not checked because she was irregular with taking her medication in the morning and may or may not take it later in the afternoon Although she was told to take her levothyroxine about 4 hours after her lunch without any food she now is taking it about 30 minutes after eating but has been more regular with this She does feel somewhat tired and a little cold also  However her TSH is now high at 11.7  Patient's weight history is as follows:  Wt Readings from Last 3 Encounters:  07/20/18 132 lb (59.9 kg)  06/28/18 134 lb 12 oz (61.1 kg)  06/20/18 132 lb 9.6 oz (60.1 kg)    Thyroid function results have been as follows:  Lab Results  Component Value Date   TSH 11.68 (H) 08/03/2018   TSH 3.52 12/30/2017   TSH 0.31 (L) 05/08/2016   TSH 0.30 (L) 02/05/2015   FREET4 0.87 08/03/2018   FREET4 1.01 12/30/2017   FREET4 1.44 05/08/2016   FREET4 1.62 (H) 02/05/2015     Past Medical History:  Diagnosis Date  . Anemia   . Graves disease   . Hayfever   . Thyroid disease     Past  Surgical History:  Procedure Laterality Date  . CESAREAN SECTION    . THYROIDECTOMY      Family History  Problem Relation Age of Onset  . Hypertension Mother   . Diabetes Maternal Grandfather   . Diabetes Paternal Grandfather   . Thyroid disease Other     Social History:  reports that she has quit smoking. She has never used smokeless tobacco. She reports current alcohol use. She reports current drug use. Drug: Marijuana.  Allergies:  No Known Allergies  Allergies as of 08/09/2018   No Known Allergies     Medication List       Accurate as of August 08, 2018  9:51 PM. Always use your most recent med list.        cyclobenzaprine 10 MG tablet Commonly known as:  FLEXERIL Take 1 tablet (10 mg total) by mouth 2 (two) times daily as needed for muscle spasms.   Ferrous Fum-Iron Polysacch-FA 162-115.2-1 MG Caps Take 1 capsule by mouth daily with breakfast.   levothyroxine 150 MCG tablet Commonly known as:  SYNTHROID, LEVOTHROID TAKE 1 TABLET BY MOUTH EVERY DAY          Review of Systems    No history of hypertension  BP Readings from Last  3 Encounters:  07/20/18 (!) 116/93  06/28/18 132/89  06/20/18 124/78   She apparently has had chronic anemia and usually cannot take iron supplements because of constipation She was recommended iron polysaccharide on her last visit but she did not fill the prescription and did not try taking tandem Has not followed up with PCP She does have iron deficiency documented on her hemoglobin is still relatively low  Lab Results  Component Value Date   HGB 11.4 (L) 08/03/2018            Examination:    There were no vitals taken for this visit.  Not examined, she looks well on the video screen   Assessment:  HYPOTHYROIDISM, postsurgical since age 37  She has some fatigue persisting Her TSH is high This is likely to be from both her taking the levothyroxine postprandially as well as most likely needs a higher dose than the  150 mcg she is taking  ANEMIA: Still not treated Also needs to follow-up with her PCP  HIRSUTISM: This is not related to PCOS   PLAN:   For her thyroid management she will need to start taking her levothyroxine before breakfast on empty stomach with water consistently Most likely however she does need a higher dose but she is not wanting to change at this time since her symptoms are relatively mild even though her TSH is higher than usual  For her iron supplements she can start taking One-A-Day women's multivitamin with extra iron  She is somewhat symptomatic with hirsutism but does not want to be on a systemic medicine such as Aldactone In the meantime she can try Vaniqa topically since she does not have evidence of PCOS   She needs to establish with a PCP and this was reiterated  Thyroid levels to be checked again in 6 weeks  Follow-up 6 weeks   Reather Littler 08/08/2018, 9:51 PM     Note: This office note was prepared with Dragon voice recognition system technology. Any transcriptional errors that result from this process are unintentional.

## 2018-08-09 ENCOUNTER — Other Ambulatory Visit: Payer: Self-pay

## 2018-08-09 ENCOUNTER — Ambulatory Visit (INDEPENDENT_AMBULATORY_CARE_PROVIDER_SITE_OTHER): Payer: 59 | Admitting: Endocrinology

## 2018-08-09 ENCOUNTER — Ambulatory Visit: Payer: 59 | Admitting: Endocrinology

## 2018-08-09 ENCOUNTER — Encounter: Payer: Self-pay | Admitting: Endocrinology

## 2018-08-09 DIAGNOSIS — E890 Postprocedural hypothyroidism: Secondary | ICD-10-CM

## 2018-08-09 DIAGNOSIS — R030 Elevated blood-pressure reading, without diagnosis of hypertension: Secondary | ICD-10-CM | POA: Diagnosis not present

## 2018-08-09 DIAGNOSIS — D508 Other iron deficiency anemias: Secondary | ICD-10-CM | POA: Diagnosis not present

## 2018-08-09 DIAGNOSIS — L680 Hirsutism: Secondary | ICD-10-CM

## 2018-08-09 MED ORDER — EFLORNITHINE HCL 13.9 % EX CREA: TOPICAL_CREAM | CUTANEOUS | 0 refills | Status: DC

## 2018-09-23 ENCOUNTER — Other Ambulatory Visit: Payer: Self-pay | Admitting: Endocrinology

## 2018-12-01 ENCOUNTER — Other Ambulatory Visit: Payer: Self-pay | Admitting: Endocrinology

## 2018-12-31 ENCOUNTER — Inpatient Hospital Stay (HOSPITAL_COMMUNITY): Payer: 59

## 2018-12-31 ENCOUNTER — Inpatient Hospital Stay (HOSPITAL_COMMUNITY)
Admission: AD | Admit: 2018-12-31 | Discharge: 2018-12-31 | Disposition: A | Discharge status: Home / Self Care | Payer: 59 | Attending: Obstetrics and Gynecology | Admitting: Obstetrics and Gynecology

## 2018-12-31 ENCOUNTER — Other Ambulatory Visit: Payer: Self-pay

## 2018-12-31 DIAGNOSIS — Z6790 Unspecified blood type, Rh positive: Secondary | ICD-10-CM

## 2018-12-31 DIAGNOSIS — O4690 Antepartum hemorrhage, unspecified, unspecified trimester: Secondary | ICD-10-CM | POA: Insufficient documentation

## 2018-12-31 DIAGNOSIS — N939 Abnormal uterine and vaginal bleeding, unspecified: Secondary | ICD-10-CM | POA: Diagnosis not present

## 2018-12-31 DIAGNOSIS — O26899 Other specified pregnancy related conditions, unspecified trimester: Secondary | ICD-10-CM

## 2018-12-31 DIAGNOSIS — O26891 Other specified pregnancy related conditions, first trimester: Secondary | ICD-10-CM | POA: Insufficient documentation

## 2018-12-31 DIAGNOSIS — R102 Pelvic and perineal pain: Secondary | ICD-10-CM | POA: Insufficient documentation

## 2018-12-31 DIAGNOSIS — O9989 Other specified diseases and conditions complicating pregnancy, childbirth and the puerperium: Secondary | ICD-10-CM | POA: Diagnosis not present

## 2018-12-31 LAB — CBC
HCT: 35.1 % — ABNORMAL LOW (ref 36.0–46.0)
Hemoglobin: 11.5 g/dL — ABNORMAL LOW (ref 12.0–15.0)
MCH: 23.8 pg — ABNORMAL LOW (ref 26.0–34.0)
MCHC: 32.8 g/dL (ref 30.0–36.0)
MCV: 72.5 fL — ABNORMAL LOW (ref 80.0–100.0)
Platelets: 303 10*3/uL (ref 150–400)
RBC: 4.84 MIL/uL (ref 3.87–5.11)
RDW: 15.6 % — ABNORMAL HIGH (ref 11.5–15.5)
WBC: 5.8 10*3/uL (ref 4.0–10.5)
nRBC: 0 % (ref 0.0–0.2)

## 2018-12-31 LAB — POCT PREGNANCY, URINE: Preg Test, Ur: NEGATIVE

## 2018-12-31 LAB — HCG, QUANTITATIVE, PREGNANCY: hCG, Beta Chain, Quant, S: 8 m[IU]/mL — ABNORMAL HIGH (ref <5)

## 2018-12-31 NOTE — MAU Note (Signed)
Broadus John from transport notified of pt. States he will be here in 10-12 minutes

## 2018-12-31 NOTE — MAU Provider Note (Addendum)
First Provider Initiated Contact with Patient 12/31/18 1558      S [name] is a 33 y.o. [phone] non-pregnant female who presents to MAU today with complaint of vaginal bleeding, left-sided lower abdominal pressure, and positive home pregnancy test. UPT negative in MAU, hCG 8. Pt denies abdominal/pelvic pain and reports the pressure is intermittent and is not present at this time. Pt is tearful, explaining that she has not had this happen with other pregnancies and is concerned it could be a miscarriage. LMP 11/25/2018. Pt reports she has regular, 30-day cycles that she tracks with an app on her phone.  O BP (!) 174/118 (BP Location: Left Arm)   Pulse 63   Temp 98.3 F (36.8 C) (Oral)   Resp 18   LMP 11/25/2018   SpO2 100%  Physical Exam  Constitutional: She is oriented to person, place, and time. She appears well-developed and well-nourished. No distress.  HENT:  Head: Normocephalic and atraumatic.  Respiratory: Effort normal.  Neurological: She is alert and oriented to person, place, and time.  Skin: Skin is warm and dry. She is not diaphoretic.  Psychiatric: She has a normal mood and affect. Her behavior is normal. Judgment and thought content normal.   A Early pregnancy/Implantation Bleeding vs. Miscarriage Medical screening exam complete -pt advised that based on her s/sx and hCG level, the best course of action is to repeat hCG in ~48hrs to determine hCG trend. Pt states that she would like to have an examination to "see whats inside" her uterus. Discussed with patient the full ectopic work-up typically done in MAU and discussed strong possibility of not seeing anything on [place] given hCG level. Pt still tearful and concerned that something is wrong and requests an US performed, but declines pelvic exam and vaginal swabs (even swabs done via self collection) stating "I don't think I have an infection."- -CBC: H/H 11.5/35.1 -hCG: 8 -ABO: O Positive -when RN approached  patient to take her to [place], pt reports she is no longer interested in an [place] and is upset that the [place] tech cannot show her pictures and cannot explain to her what she is seeing. RN reports patient is also upset that she may wait up to 54min for the [place] report to be finalized, but provider had already explained to patient that she could be waiting up to an hour more in MAU for results. RN came to provider to request that provider speak with patient directly. -provider explained to patient why the [place] tech is not allowed to explain images to patient and patient reports she understands the hospital protocol and does not appear upset. Pt informed that provider will be able to show her images and explain them to her after the procedure is finished. Despite this conversation, pt reports that she is "ready to focus on something else" and says that she wants to leave MAU at this time and will follow-up in the clinic instead, as previously discussed. Pt also reports that she does not want to continue to wait in MAU. -RN came to provider after discharge instructions were entered for patient for elevated BP in 180s/100s. Repeat BP in 170s/110s. Provider at bedside to discuss elevated BP with patient and recommended transfer to the ED for evaluation and possible to start on medication. Pt visibly upset, stating things like, "no, I'm not taking medication. I don't think I need medication" and "my blood pressure is normally fine. It's because I haven't eaten all day. I was  just at the doctor's and it was fine. My blood pressure is always elevated when I'm stressed and I've been crying all day and this has taken up my whole day." Explained to patient that hypertension can be a silent condition and can results in stroke, MI, death. Recommended again for transfer to ED for evaluation, pt reluctantly agrees. -called and spoke with Dr. Donnald GarrePfeiffer @1735  re: transfer, requests manual BP to be taken, RN notified. RN reports BP with manual  cuff in 180s/100s. -pt transferred to ED  P Pt transferred to ED for evaluation of elevated BP [facility] @0830  for repeat hCG Warning signs for worsening condition that would warrant emergency follow-up discussed Patient may return to MAU as needed for pregnancy related complaints  , Odie SeraNicole E, NP 12/31/2018 5:58 PM

## 2018-12-31 NOTE — MAU Note (Signed)
nugent NP to see pt in triage bc elevated BP

## 2018-12-31 NOTE — MAU Note (Signed)
Nugent NP requests this RN to take a manual BP. BP of 182/100 obtained. Nugent NP informed

## 2018-12-31 NOTE — MAU Note (Signed)
callie RN called, report given

## 2018-12-31 NOTE — Discharge Instructions (Signed)
Vaginal Bleeding During Pregnancy, First Trimester  A small amount of bleeding from the vagina (spotting) is relatively common during early pregnancy. It usually stops on its own. Various things may cause bleeding or spotting during early pregnancy. Some bleeding may be related to the pregnancy, and some may not. In many cases, the bleeding is normal and is not a problem. However, bleeding can also be a sign of something serious. Be sure to tell your health care provider about any vaginal bleeding right away. Some possible causes of vaginal bleeding during the first trimester include:  Infection or inflammation of the cervix.  Growths (polyps) on the cervix.  Miscarriage or threatened miscarriage.  Pregnancy tissue developing outside of the uterus (ectopic pregnancy).  A mass of tissue developing in the uterus due to an egg being fertilized incorrectly (molar pregnancy). Follow these instructions at home: Activity  Follow instructions from your health care provider about limiting your activity. Ask what activities are safe for you.  If needed, make plans for someone to help with your regular activities.  Do not have sex or orgasms until your health care provider says that this is safe. General instructions  Take over-the-counter and prescription medicines only as told by your health care provider.  Pay attention to any changes in your symptoms.  Do not use tampons or douche.  Write down how many pads you use each day, how often you change pads, and how soaked (saturated) they are.  If you pass any tissue from your vagina, save the tissue so you can show it to your health care provider.  Keep all follow-up visits as told by your health care provider. This is important. Contact a health care provider if:  You have vaginal bleeding during any part of your pregnancy.  You have cramps or labor pains.  You have a fever. Get help right away if:  You have severe cramps in your  back or abdomen.  You pass large clots or a large amount of tissue from your vagina.  Your bleeding increases.  You feel light-headed or weak, or you faint.  You have chills.  You are leaking fluid or have a gush of fluid from your vagina. Summary  A small amount of bleeding (spotting) from the vagina is relatively common during early pregnancy.  Various things may cause bleeding or spotting in early pregnancy.  Be sure to tell your health care provider about any vaginal bleeding right away. This information is not intended to replace advice given to you by your health care provider. Make sure you discuss any questions you have with your health care provider. Document Released: 02/04/2005 Document Revised: 08/16/2018 Document Reviewed: 07/30/2016 Elsevier Patient Education  2020 [employer]. Ectopic Pregnancy  An ectopic pregnancy is when the fertilized egg attaches (implants) outside the uterus. Most ectopic pregnancies occur in one of the tubes where eggs travel from the ovary to the uterus (fallopian tubes), but the implanting can occur in other locations. In rare cases, ectopic pregnancies occur on the ovary, intestine, pelvis, abdomen, or cervix. In an ectopic pregnancy, the fertilized egg does not have the ability to develop into a normal, healthy baby. A ruptured ectopic pregnancy is one in which tearing or bursting of a fallopian tube causes internal bleeding. Often, there is intense lower abdominal pain, and vaginal bleeding sometimes occurs. Having an ectopic pregnancy can be life-threatening. If this dangerous condition is not treated, it can lead to blood loss, shock, or even death. What are the  causes? The most common cause of this condition is damage to one of the fallopian tubes. A fallopian tube may be narrowed or blocked, and that keeps the fertilized egg from reaching the uterus. What increases the risk? This condition is more likely to develop in women of childbearing  age who have different levels of risk. The levels of risk can be divided into three categories. High risk  You have gone through infertility treatment.  You have had an ectopic pregnancy before.  You have had surgery on the fallopian tubes, or another surgical procedure, such as an abortion.  You have had surgery to have the fallopian tubes tied (tubal ligation).  You have problems or diseases of the fallopian tubes.  You have been exposed to diethylstilbestrol (DES). This medicine was used until 1971, and it had effects on babies whose mothers took the medicine.  You become pregnant while using an IUD (intrauterine device) for birth control. Moderate risk  You have a history of infertility.  You have had an STI (sexually transmitted infection).  You have a history of pelvic inflammatory disease (PID).  You have scarring from endometriosis.  You have multiple sexual partners.  You smoke. Low risk  You have had pelvic surgery.  You use vaginal douches.  You became sexually active before age 13. What are the signs or symptoms? Common symptoms of this condition include normal pregnancy symptoms, such as missing a period, nausea, tiredness, abdominal pain, breast tenderness, and bleeding. However, ectopic pregnancy will have additional symptoms, such as:  Pain with intercourse.  Irregular vaginal bleeding or spotting.  Cramping or pain on one side or in the lower abdomen.  Fast heartbeat, low blood pressure, and sweating.  Passing out while having a bowel movement. Symptoms of a ruptured ectopic pregnancy and internal bleeding may include:  Sudden, severe pain in the abdomen and pelvis.  Dizziness, weakness, light-headedness, or fainting.  Pain in the shoulder or neck area. How is this diagnosed? This condition is diagnosed by:  A pelvic exam to locate pain or a mass in the abdomen.  A pregnancy test. This blood test checks for the presence as well as the  specific level of pregnancy hormone in the bloodstream.  Ultrasound. This is performed if a pregnancy test is positive. In this test, a probe is inserted into the vagina. The probe will detect a fetus, possibly in a location other than the uterus.  Taking a sample of uterus tissue (dilation and curettage, or D&C).  Surgery to perform a visual exam of the inside of the abdomen using a thin, lighted tube that has a tiny camera on the end (laparoscope).  Culdocentesis. This procedure involves inserting a needle at the top of the vagina, behind the uterus. If blood is present in this area, it may indicate that a fallopian tube is torn. How is this treated? This condition is treated with medicine or surgery. Medicine  An injection of a medicine (methotrexate) may be given to cause the pregnancy tissue to be absorbed. This medicine may save your fallopian tube. It may be given if: ? The diagnosis is made early, with no signs of active bleeding. ? The fallopian tube has not ruptured. ? You are considered to be a good candidate for the medicine. Usually, pregnancy hormone blood levels are checked after methotrexate treatment. This is to be sure that the medicine is effective. It may take 4-6 weeks for the pregnancy to be absorbed. Most pregnancies will be absorbed by 3  weeks. °Surgery °· A laparoscope may be used to remove the pregnancy tissue. °· If severe internal bleeding occurs, a larger cut (incision) may be made in the lower abdomen (laparotomy) to remove the fetus and placenta. This is done to stop the bleeding. °· Part or all of the fallopian tube may be removed (salpingectomy) along with the fetus and placenta. The fallopian tube may also be repaired during the surgery. °· In very rare circumstances, removal of the uterus (hysterectomy) may be required. °· After surgery, pregnancy hormone testing may be done to be sure that there is no pregnancy tissue left. °Whether your treatment is medicine or  surgery, you may receive a Rho (D) immune globulin shot to prevent problems with any future pregnancy. This shot may be given if: °· You are Rh-negative and the baby's father is Rh-positive. °· You are Rh-negative and you do not know the Rh type of the baby's father. °Follow these instructions at home: °· Rest and limit your activity after the procedure for as long as told by your health care provider. °· Until your health care provider says that it is safe: °? Do not lift anything that is heavier than 10 lb (4.5 kg), or the limit that your health care provider tells you. °? Avoid physical exercise and any movement that requires effort (is strenuous). °· To help prevent constipation: °? Eat a healthy diet that includes fruits, vegetables, and whole grains. °? Drink 6-8 glasses of water per day. °Get help right away if: °· You develop worsening pain that is not relieved by medicine. °· You have: °? A fever or chills. °? Vaginal bleeding. °? Redness and swelling at the incision site. °? Nausea and vomiting. °· You feel dizzy or weak. °· You feel light-headed or you faint. °This information is not intended to replace advice given to you by your health care provider. Make sure you discuss any questions you have with your health care provider. °Document Released: 06/04/2004 Document Revised: 04/09/2017 Document Reviewed: 11/27/2015 °Elsevier Patient Education © 2020 Elsevier Inc. ° °

## 2018-12-31 NOTE — Progress Notes (Signed)
Pt requesting this RN to recheck BP on left arm

## 2018-12-31 NOTE — MAU Note (Signed)
This RN was transporting pt to u/s and was informing pt of procedure and approximate wait time after u/s performed. Pt states she has been here long enough and does not want to wait that long and would like to be scheduled something in a few days outpatient as previously discussed with Nugent NP. This RN informs Nugent NP of pt request and NP states she will talk to pt in family room.

## 2018-12-31 NOTE — MAU Note (Signed)
Carol Kelley is a 33 y.o. here in MAU reporting: + UPT on the 19th. Reporting bleeding since today, states she saw a few clots. Having some lower left abdominal pressure but no pain. States she only sees the bleeding when she wipes.  LMP: 11/25/18  Onset of complaint: today  Pain score: 0/10  Vitals:   12/31/18 1418  BP: (!) 153/96  Pulse: 67  Resp: 16  Temp: 98.3 F (36.8 C)  SpO2: 100%      Lab orders placed from triage: UPT. D/w Nugent NP and she will order hcg quant level

## 2019-01-02 ENCOUNTER — Telehealth: Payer: Self-pay | Admitting: Family Medicine

## 2019-01-02 NOTE — Telephone Encounter (Signed)
Spoke to patient about her appointment on 8/25 @ 8:30. Patient instructed to wear a face mask for the entire appointment and no visitors are allowed with her during the visit. Patient screened for covid symptoms and denied having any

## 2019-01-03 ENCOUNTER — Ambulatory Visit (INDEPENDENT_AMBULATORY_CARE_PROVIDER_SITE_OTHER): Payer: 59

## 2019-01-03 ENCOUNTER — Other Ambulatory Visit: Payer: Self-pay

## 2019-01-03 DIAGNOSIS — O3680X0 Pregnancy with inconclusive fetal viability, not applicable or unspecified: Secondary | ICD-10-CM

## 2019-01-03 LAB — BETA HCG QUANT (REF LAB): hCG Quant: 2 m[IU]/mL

## 2019-01-03 NOTE — Progress Notes (Signed)
Pt here today for STAT Beta Lab.  Pt informs me that she is here today because she went to MAU due to having vaginal bleeding and lower dull ache pain in left side and she did a urine pregnancy test in MAU and it resulted negative.  Pt reports that they drew a HCG level which came back as 8.  Pt begins to cry because she states that she feels like she lost the pregnancy because she also passed a large clot on Saturday before going to MAU.  I explained to the pt that it will take approximately two hours before we get results in which I call her with results and f/u.  Pt verbalized understanding.   Received stat beta results from [facility]  [place], CNM who stated that pt has miscarried and to f/u if she has concerns or wants to discuss BC in two weeks.  Called pt and LM that I am calling with results if she could please call the office back. Notified pt provider's recommendation.  Pt stated that she was going to f/u at [facility].  Pt did not have any other concerns.    Mel Almond, RN 01/03/19

## 2019-01-08 NOTE — Progress Notes (Signed)
I reviewed the note and agree with the nursing assessment and plan.   Flavia Bruss, CNM 08/06/2017 10:32 AM   

## 2019-02-13 ENCOUNTER — Other Ambulatory Visit: Payer: Self-pay

## 2019-02-13 ENCOUNTER — Telehealth: Payer: Self-pay | Admitting: Endocrinology

## 2019-02-13 DIAGNOSIS — E890 Postprocedural hypothyroidism: Secondary | ICD-10-CM

## 2019-02-13 MED ORDER — LEVOTHYROXINE SODIUM 150 MCG PO TABS: 150.0000 ug | ORAL_TABLET | Freq: Every day | ORAL | 1 refills | Status: DC

## 2019-02-13 NOTE — Telephone Encounter (Signed)
levothyroxine (SYNTHROID) 150 MCG tablet 30 tablet 1 02/13/2019    Sig - Route: Take 1 tablet (150 mcg total) by mouth daily. - Oral   Sent to pharmacy as: levothyroxine (SYNTHROID) 150 MCG tablet   E-Prescribing Status: Receipt confirmed by pharmacy (02/13/2019 3:12 PM EDT)

## 2019-02-13 NOTE — Telephone Encounter (Signed)
MEDICATION: Levothyroxine  PHARMACY:  [facility] [place] [facility] [address] [facility]  No   IS PATIENT OUT OF MEDICATION: 2 days  IF NOT; HOW MUCH IS LEFT:   LAST APPOINTMENT DATE: @7 /23/2020  NEXT APPOINTMENT DATE:@10 /01/2019  DO WE HAVE YOUR PERMISSION TO LEAVE A DETAILED MESSAGE: YES  OTHER COMMENTS: Patient is scheduled for labs on 02/17/19 and F/U DOXY 02/20/19   **Let patient know to contact pharmacy at the end of the day to make sure medication is ready. **  ** Please notify patient to allow 48-72 hours to process**  **Encourage patient to contact the pharmacy for refills or they can request refills through [facility]**

## 2019-02-17 ENCOUNTER — Other Ambulatory Visit (INDEPENDENT_AMBULATORY_CARE_PROVIDER_SITE_OTHER): Payer: 59

## 2019-02-17 ENCOUNTER — Other Ambulatory Visit: Payer: Self-pay

## 2019-02-17 DIAGNOSIS — L680 Hirsutism: Secondary | ICD-10-CM | POA: Diagnosis not present

## 2019-02-17 DIAGNOSIS — R030 Elevated blood-pressure reading, without diagnosis of hypertension: Secondary | ICD-10-CM | POA: Diagnosis not present

## 2019-02-17 DIAGNOSIS — E890 Postprocedural hypothyroidism: Secondary | ICD-10-CM | POA: Diagnosis not present

## 2019-02-17 LAB — BASIC METABOLIC PANEL
BUN: 8 mg/dL (ref 6–23)
CO2: 28 mEq/L (ref 19–32)
Calcium: 9.6 mg/dL (ref 8.4–10.5)
Chloride: 103 mEq/L (ref 96–112)
Creatinine, Ser: 0.75 mg/dL (ref 0.40–1.20)
GFR: 108.11 mL/min (ref >60.00)
Glucose, Bld: 77 mg/dL (ref 70–99)
Potassium: 3.7 mEq/L (ref 3.5–5.1)
Sodium: 138 mEq/L (ref 135–145)

## 2019-02-17 LAB — IBC PANEL
Iron: 79 ug/dL (ref 42–145)
Saturation Ratios: 23.4 % (ref 20.0–50.0)
Transferrin: 241 mg/dL (ref 212.0–360.0)

## 2019-02-17 LAB — TSH: TSH: 2.09 u[IU]/mL (ref 0.35–4.50)

## 2019-02-17 LAB — T4, FREE: Free T4: 1.3 ng/dL (ref 0.60–1.60)

## 2019-02-20 ENCOUNTER — Encounter: Payer: Self-pay | Admitting: Endocrinology

## 2019-02-20 ENCOUNTER — Other Ambulatory Visit: Payer: Self-pay

## 2019-02-20 ENCOUNTER — Ambulatory Visit (INDEPENDENT_AMBULATORY_CARE_PROVIDER_SITE_OTHER): Payer: 59 | Admitting: Endocrinology

## 2019-02-20 DIAGNOSIS — E890 Postprocedural hypothyroidism: Secondary | ICD-10-CM | POA: Diagnosis not present

## 2019-02-20 NOTE — Progress Notes (Signed)
Patient ID: Carol Kelley, female   DOB: 09/26/1985, 33 y.o.   MRN: [id number]             Reason for Appointment: Endocrinology follow-up  Today's office visit was provided via telemedicine using a telephone call to the patient Patient has been explained the limitations of evaluation and management by telemedicine and the availability of in person appointments.  The patient understood the limitations and agreed to proceed. Patient also understood that the telehealth visit is billable. . Location of the patient: Home . Location of the provider: Office Only the patient and myself were participating in the encounter    History of Present Illness:   PROBLEM 1: Hypothyroidism was first diagnosed  at age 18  At the time of diagnosis patient had thyroidectomy for Graves' disease   If she is irregular with her thyroid supplement she starts having symptoms of  fatigue She tends to have long-term cold sensitivity also      The patient has been previously treated with  doses of levothyroxine between 125 and 175 mcg   RECENT history:  She has been taking 150 mcg levothyroxine for some time  On her last visit in 07/2018 she had a high TSH She was also complaining of feeling tired with some cold intolerance  She was taking her levothyroxine after eating lunch She was told to take this and take it in the morning She has been able to do this regularly and takes it when she wakes up with water and no other supplements  She feels fairly good now However she is now [redacted] weeks pregnant  TSH back to normal at 2.1  Patient's weight history is as follows:  Wt Readings from Last 3 Encounters:  07/20/18 132 lb (59.9 kg)  06/28/18 134 lb 12 oz (61.1 kg)  06/20/18 132 lb 9.6 oz (60.1 kg)    Thyroid function results have been as follows:  Lab Results  Component Value Date   TSH 2.09 02/17/2019   TSH 11.68 (H) 08/03/2018   TSH 3.52 12/30/2017   TSH 0.31 (L) 05/08/2016   FREET4 1.30  02/17/2019   FREET4 0.87 08/03/2018   FREET4 1.01 12/30/2017   FREET4 1.44 05/08/2016     Past Medical History:  Diagnosis Date  . Anemia   . Graves disease   . Hayfever   . Thyroid disease     Past Surgical History:  Procedure Laterality Date  . CESAREAN SECTION    . THYROIDECTOMY      Family History  Problem Relation Age of Onset  . Hypertension Mother   . Diabetes Maternal Grandfather   . Diabetes Paternal Grandfather   . Thyroid disease Other     Social History:  reports that she has quit smoking. She has never used smokeless tobacco. She reports current alcohol use. She reports current drug use. Drug: Marijuana.  Allergies:  No Known Allergies  Allergies as of 02/20/2019   No Known Allergies     Medication List       Accurate as of February 20, 2019  1:00 PM. If you have any questions, ask your nurse or doctor.        cyclobenzaprine 10 MG tablet Commonly known as: FLEXERIL Take 1 tablet (10 mg total) by mouth 2 (two) times daily as needed for muscle spasms.   Eflornithine HCl 13.9 % cream Apply a small quantity to the areas of facial hair once a day   Ferrous Fum-Iron Polysacch-FA 162-115.2-1 MG  Caps Take 1 capsule by mouth daily with breakfast.   levothyroxine 150 MCG tablet Commonly known as: SYNTHROID Take 1 tablet (150 mcg total) by mouth daily.          Review of Systems  No history of hypertension, tends to have variable blood pressures  BP Readings from Last 3 Encounters:  12/31/18 (!) 174/118  07/20/18 (!) 116/93  06/28/18 132/89   She apparently has had chronic anemia and usually cannot take iron supplements because of constipation She was recommended iron polysaccharide    She does have iron deficiency documented Cannot take iron supplements Her hemoglobin is still relatively low as below  Lab Results  Component Value Date   HGB 11.5 (L) 12/31/2018    She has history of hirsutism        Examination:    There were  no vitals taken for this visit.     Assessment:  HYPOTHYROIDISM, postsurgical since age 39  Her TSH is back to normal with her taking levothyroxine as directed before breakfast Subjectively doing well She has been on 150 mcg for some time  ANEMIA: Mild and currently not treated, last checked in August She will follow up with her gynecologist since she currently does not have a PCP and cannot tolerate oral iron     PLAN:   No change in dosage  Follow-up 8 weeks, she can follow-up with a local endocrinologist if she is able to establish one She needs to be followed every 2 months during her pregnancy  Duration of the leg encounter =5 minutes  Elayne Snare 02/20/2019, 1:00 PM     Note: This office note was prepared with Dragon voice recognition system technology. Any transcriptional errors that result from this process are unintentional.

## 2019-04-13 ENCOUNTER — Other Ambulatory Visit: Payer: Self-pay | Admitting: Endocrinology

## 2019-04-13 DIAGNOSIS — E890 Postprocedural hypothyroidism: Secondary | ICD-10-CM

## 2019-05-18 ENCOUNTER — Other Ambulatory Visit: Payer: Self-pay | Admitting: Endocrinology

## 2019-05-18 DIAGNOSIS — E890 Postprocedural hypothyroidism: Secondary | ICD-10-CM

## 2019-06-01 ENCOUNTER — Other Ambulatory Visit: Payer: Self-pay | Admitting: Endocrinology

## 2019-06-01 DIAGNOSIS — E890 Postprocedural hypothyroidism: Secondary | ICD-10-CM
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
## 2020-10-21 ENCOUNTER — Telehealth: Payer: Self-pay | Admitting: Endocrinology

## 2020-10-21 NOTE — Telephone Encounter (Signed)
Patient called to schedule follow up appt on 11/05/20 and prior labs on 10/28/20. Active Request Tab does not show any Lab Orders.

## 2020-10-23 ENCOUNTER — Other Ambulatory Visit: Payer: Self-pay | Admitting: Endocrinology

## 2020-10-23 DIAGNOSIS — E890 Postprocedural hypothyroidism: Secondary | ICD-10-CM

## 2020-10-23 NOTE — Telephone Encounter (Signed)
Pt has not been seen since 2020. Lab?

## 2020-10-28 ENCOUNTER — Other Ambulatory Visit: Payer: Self-pay

## 2020-10-28 ENCOUNTER — Other Ambulatory Visit (INDEPENDENT_AMBULATORY_CARE_PROVIDER_SITE_OTHER): Payer: Medicaid Other

## 2020-10-28 DIAGNOSIS — E890 Postprocedural hypothyroidism: Secondary | ICD-10-CM | POA: Diagnosis not present

## 2020-10-28 LAB — T4, FREE: Free T4: 0.42 ng/dL — ABNORMAL LOW (ref 0.60–1.60)

## 2020-10-29 LAB — TSH: TSH: 92.98 u[IU]/mL — ABNORMAL HIGH (ref 0.35–4.50)

## 2020-11-05 ENCOUNTER — Ambulatory Visit: Payer: 59 | Admitting: Endocrinology

## 2020-11-07 ENCOUNTER — Telehealth: Payer: Self-pay | Admitting: Endocrinology

## 2020-11-07 DIAGNOSIS — E890 Postprocedural hypothyroidism: Secondary | ICD-10-CM

## 2020-11-07 MED ORDER — LEVOTHYROXINE SODIUM 150 MCG PO TABS: 150.0000 ug | ORAL_TABLET | Freq: Every day | ORAL | 2 refills | Status: DC

## 2020-11-07 NOTE — Addendum Note (Signed)
Addended by: Eliseo Squires on: 11/07/2020 06:31 PM   Modules accepted: Orders

## 2020-11-07 NOTE — Telephone Encounter (Signed)
Patient requests to be called at ph# [phone] to be given lab results   AND Requests the following RX be asap due to Patient being completely out of the following medication  MEDICATION:   levothyroxine (SYNTHROID) 150 MCG tablet  [facility] [address] [facility] [place]  HAS THE PATIENT CONTACTED THEIR PHARMACY?  Yes  IS THIS A 90 DAY SUPPLY : No  IS PATIENT OUT OF MEDICATION: Yes  IF NOT; HOW MUCH IS LEFT: 0  LAST APPOINTMENT DATE: @6 /28/2022  NEXT APPOINTMENT DATE:@7 /20/2022  DO WE HAVE YOUR PERMISSION TO LEAVE A DETAILED MESSAGE?: Yes  OTHER COMMENTS:    **Let patient know to contact pharmacy at the end of the day to make sure medication is ready. **  ** Please notify patient to allow 48-72 hours to process**  **Encourage patient to contact the pharmacy for refills or they can request refills through [facility]**

## 2020-11-08 MED ORDER — LEVOTHYROXINE SODIUM 150 MCG PO TABS: 150.0000 ug | ORAL_TABLET | Freq: Every day | ORAL | 0 refills | Status: DC

## 2020-11-08 NOTE — Telephone Encounter (Signed)
I called pt to give her lab results. She states with work she is not sure if she is able to come in earlier than the 20th she will call us if she is able too. Rx has been sent in.

## 2020-11-08 NOTE — Addendum Note (Signed)
Addended by: Eliseo Squires on: 11/08/2020 08:45 AM   Modules accepted: Orders

## 2020-11-27 ENCOUNTER — Encounter: Payer: Self-pay | Admitting: Endocrinology

## 2020-11-27 ENCOUNTER — Other Ambulatory Visit: Payer: Self-pay

## 2020-11-27 ENCOUNTER — Ambulatory Visit (INDEPENDENT_AMBULATORY_CARE_PROVIDER_SITE_OTHER): Payer: BLUE CROSS/BLUE SHIELD | Admitting: Endocrinology

## 2020-11-27 VITALS — BP 140/78 | HR 81 | Ht 60.0 in | Wt 137.2 lb

## 2020-11-27 DIAGNOSIS — E890 Postprocedural hypothyroidism: Secondary | ICD-10-CM

## 2020-11-27 MED ORDER — LEVOTHYROXINE SODIUM 150 MCG PO TABS: 150.0000 ug | ORAL_TABLET | Freq: Every day | ORAL | 0 refills | Status: DC

## 2020-11-27 NOTE — Progress Notes (Signed)
Patient ID: Carol Kelley, female   DOB: 09/07/1985, 35 y.o.   MRN: [id number]             Reason for Appointment: Endocrinology follow-up   History of Present Illness:   PROBLEM 1: Hypothyroidism was first diagnosed  at age 75  At the time of diagnosis patient had thyroidectomy for Graves' disease   If she is irregular with her thyroid supplement she starts having symptoms of  fatigue She tends to have long-term cold sensitivity also      The patient has been previously treated with  doses of levothyroxine between 125 and 175 mcg   RECENT history:  She has been on 150 mcg levothyroxine previously Her last visit in 02/2019 At that time she had been diagnosed to have early pregnancy but did not follow-up since she moved out of town  She thinks she may have been taking 175 mcg levothyroxine later in pregnancy and possibly subsequently also Not clear if she has had any regular follow-up  She was running out of her levothyroxine a few weeks ago and had not taken any when she had her labs done about a month ago She was complaining of feeling moody and also somewhat tired with some cold intolerance However she also says that she has had some difficulty sleeping and taking care of her baby  She takes generic levothyroxine supplement She has been able to recently start taking her levothyroxine regularly and takes it when she wakes up with water and no other supplements  She feels a little better now with restarting her dose  TSH last month was 93  Patient's weight history is as follows:  Wt Readings from Last 3 Encounters:  11/27/20 137 lb 3.2 oz (62.2 kg)  07/20/18 132 lb (59.9 kg)  06/28/18 134 lb 12 oz (61.1 kg)    Thyroid function results have been as follows:  Lab Results  Component Value Date   TSH 92.98 (H) 10/28/2020   TSH 2.09 02/17/2019   TSH 11.68 (H) 08/03/2018   TSH 3.52 12/30/2017   FREET4 0.42 (L) 10/28/2020   FREET4 1.30 02/17/2019   FREET4 0.87  08/03/2018   FREET4 1.01 12/30/2017     Past Medical History:  Diagnosis Date   Anemia    Graves disease    Hayfever    Thyroid disease     Past Surgical History:  Procedure Laterality Date   CESAREAN SECTION     THYROIDECTOMY      Family History  Problem Relation Age of Onset   Hypertension Mother    Diabetes Maternal Grandfather    Diabetes Paternal Grandfather    Thyroid disease Other     Social History:  reports that she has been smoking cigars. She has never used smokeless tobacco. She reports current alcohol use. She reports current drug use. Drug: Marijuana.  Allergies:  No Known Allergies  Allergies as of 11/27/2020   No Known Allergies      Medication List        Accurate as of November 27, 2020  9:32 AM. If you have any questions, ask your nurse or doctor.          cyclobenzaprine 10 MG tablet Commonly known as: FLEXERIL Take 1 tablet (10 mg total) by mouth 2 (two) times daily as needed for muscle spasms.   Eflornithine HCl 13.9 % cream Apply a small quantity to the areas of facial hair once a day   Ferrous Fum-Iron Polysacch-FA 162-115.2-1  MG Caps Take 1 capsule by mouth daily with breakfast.   levothyroxine 150 MCG tablet Commonly known as: SYNTHROID Take 1 tablet (150 mcg total) by mouth daily.           Review of Systems   No history of hypertension except during pregnancy, tends to have variable blood pressures  BP Readings from Last 3 Encounters:  11/27/20 140/78  12/31/18 (!) 174/118  07/20/18 (!) 116/93   History of chronic anemia and usually cannot take iron supplements because of constipation  Lab Results  Component Value Date   HGB 11.5 (L) 12/31/2018    She has history of hirsutism        Examination:    BP 140/78   Pulse 81   Ht 5' (1.524 m)   Wt 137 lb 3.2 oz (62.2 kg)   SpO2 99%   BMI 26.80 kg/m   Skin appears normal Biceps reflexes show slow relaxation   Assessment:  HYPOTHYROIDISM, postsurgical  since age 61  She has been regular with her follow-up and ran out of her supplement a few weeks ago before she called and schedule her follow-up Although she has been back on her levothyroxine for about 4 weeks Her TSH is likely not back to normal as yet since she had a baseline of 93 last month  Although she had been reportedly on 175 mcg prior to running out she is now taking 150 mcg not clear this is a [place]  Blood pressure high normal    PLAN:   She will come back in 3 weeks to have a TSH done after being on the levothyroxine consistently for at least 6 weeks since last 1 If her TSH is normal she will continue the same dose and follow-up in 4-6 months   Carol Kelley 11/27/2020, 9:32 AM     Note: This office note was prepared with Dragon voice recognition system technology. Any transcriptional errors that result from this process are unintentional.

## 2020-11-27 NOTE — Patient Instructions (Signed)
Stay on same dose for now

## 2020-12-02 ENCOUNTER — Emergency Department (HOSPITAL_BASED_OUTPATIENT_CLINIC_OR_DEPARTMENT_OTHER)
Admission: EM | Admit: 2020-12-02 | Discharge: 2020-12-02 | Disposition: A | Discharge status: Home / Self Care | Payer: BLUE CROSS/BLUE SHIELD | Attending: Emergency Medicine | Admitting: Emergency Medicine

## 2020-12-02 ENCOUNTER — Other Ambulatory Visit: Payer: Self-pay

## 2020-12-02 ENCOUNTER — Encounter (HOSPITAL_BASED_OUTPATIENT_CLINIC_OR_DEPARTMENT_OTHER): Payer: Self-pay | Admitting: Urology

## 2020-12-02 DIAGNOSIS — Z23 Encounter for immunization: Secondary | ICD-10-CM | POA: Diagnosis not present

## 2020-12-02 DIAGNOSIS — W260XXA Contact with knife, initial encounter: Secondary | ICD-10-CM | POA: Insufficient documentation

## 2020-12-02 DIAGNOSIS — S6992XA Unspecified injury of left wrist, hand and finger(s), initial encounter: Secondary | ICD-10-CM | POA: Diagnosis present

## 2020-12-02 DIAGNOSIS — S61221A Laceration with foreign body of left index finger without damage to nail, initial encounter: Secondary | ICD-10-CM | POA: Diagnosis not present

## 2020-12-02 DIAGNOSIS — F17290 Nicotine dependence, other tobacco product, uncomplicated: Secondary | ICD-10-CM | POA: Diagnosis not present

## 2020-12-02 DIAGNOSIS — S61211A Laceration without foreign body of left index finger without damage to nail, initial encounter: Secondary | ICD-10-CM

## 2020-12-02 MED ORDER — TETANUS-DIPHTH-ACELL PERTUSSIS 5-2.5-18.5 LF-MCG/0.5 IM SUSY
0.5000 mL | PREFILLED_SYRINGE | Freq: Once | INTRAMUSCULAR | Status: AC
  Administered 2020-12-02: 0.5 mL via INTRAMUSCULAR
  Filled 2020-12-02: qty 0.5

## 2020-12-02 MED ORDER — LIDOCAINE HCL 2 % IJ SOLN
15.0000 mL | Freq: Once | INTRAMUSCULAR | Status: AC
  Administered 2020-12-02: 300 mg via INTRADERMAL
  Filled 2020-12-02: qty 20

## 2020-12-02 MED ORDER — ACETAMINOPHEN 325 MG PO TABS
650.0000 mg | ORAL_TABLET | Freq: Once | ORAL | Status: AC
  Administered 2020-12-02: 650 mg via ORAL
  Filled 2020-12-02: qty 2

## 2020-12-02 NOTE — Discharge Instructions (Addendum)
Please follow-up for suture removal at either urgent care ,the emergency department, or your primary care doctor in 7-10 days.  Please return to the emergency room immediately if you experience any new or worsening symptoms or any symptoms that indicate worsening infection such as fevers, increased redness/swelling/pain, warmth, or drainage from the affected area.   

## 2020-12-02 NOTE — ED Notes (Signed)
Encouraged Pt. To obtain a PMD so she can have follow up for her blood pressure.  Pt. Reports she has had issues with B/P for several years.

## 2020-12-02 NOTE — ED Provider Notes (Signed)
[facility] EMERGENCY DEPARTMENT Provider Note   CSN: [id number] Arrival date & time: 12/02/20  1647     History Chief Complaint  Patient presents with   Laceration    Carol Kelley is a 35 y.o. female.  HPI  35 year old female with a history of anemia, Graves' disease, hayfever, who presents to the emergency department today for evaluation of a laceration.  States she cut her finger with a serrated knife prior to arrival.  Laceration located to the tip of the left index finger.  She denies any sensory changes or changes in range of motion.  She is not sure when her last tetanus shot was.  Past Medical History:  Diagnosis Date   Anemia    Graves disease    Hayfever    Thyroid disease     Patient Active Problem List   Diagnosis Date Noted   Elevated testosterone level in female 02/06/2015   Hirsutism 02/06/2015   Postsurgical hypothyroidism 02/05/2015    Past Surgical History:  Procedure Laterality Date   CESAREAN SECTION     THYROIDECTOMY       OB History     Gravida  3   Para  1   Term  1   Preterm      AB  2   Living  1      SAB  1   IAB  1   Ectopic      Multiple      Live Births  1           Family History  Problem Relation Age of Onset   Hypertension Mother    Diabetes Maternal Grandfather    Diabetes Paternal Grandfather    Thyroid disease Other     Social History   Tobacco Use   Smoking status: Some Days    Types: Cigars   Smokeless tobacco: Never  Vaping Use   Vaping Use: Never used  Substance Use Topics   Alcohol use: Yes    Alcohol/week: 0.0 standard drinks    Comment: occ   Drug use: Yes    Types: Marijuana    Home Medications Prior to Admission medications   Medication Sig Start Date End Date Taking? Authorizing Provider  levothyroxine (SYNTHROID) 150 MCG tablet Take 1 tablet (150 mcg total) by mouth daily. 11/27/20   Reather Littler, MD    Allergies    Patient has no known allergies.  Review  of Systems   Review of Systems  Constitutional:  Negative for fever.  Skin:  Positive for wound.  Neurological:  Negative for weakness and numbness.   Physical Exam Updated Vital Signs BP (!) 170/130 (BP Location: Left Arm)   Pulse 65   Temp 98.9 F (37.2 C) (Oral)   Resp 16   Ht 5' (1.524 m)   Wt 62.2 kg   SpO2 100%   BMI 26.80 kg/m   Physical Exam Vitals and nursing note reviewed.  Constitutional:      General: She is not in acute distress.    Appearance: She is well-developed.  HENT:     Head: Normocephalic and atraumatic.  Eyes:     Conjunctiva/sclera: Conjunctivae normal.  Cardiovascular:     Rate and Rhythm: Normal rate.  Pulmonary:     Effort: Pulmonary effort is normal.  Musculoskeletal:        General: Normal range of motion.     Cervical back: Neck supple.  Skin:    General: Skin is  warm and dry.     Comments: 1 cm lac to the left index finger. No active bleeding. NVI distally  Neurological:     Mental Status: She is alert.    ED Results / Procedures / Treatments   Labs (all labs ordered are listed, but only abnormal results are displayed) Labs Reviewed - No data to display  EKG None  Radiology No results found.  Procedures .Marland KitchenLaceration Repair  Date/Time: 12/02/2020 8:20 PM Performed by: Karrie Meres, PA-C Authorized by: Karrie Meres, PA-C   Consent:    Consent obtained:  Verbal   Consent given by:  Patient   Risks, benefits, and alternatives were discussed: yes     Risks discussed:  Infection and pain   Alternatives discussed:  No treatment Universal protocol:    Procedure explained and questions answered to patient or proxy's satisfaction: yes     Immediately prior to procedure, a time out was called: yes     Patient identity confirmed:  Verbally with patient Anesthesia:    Anesthesia method:  Nerve block   Block needle gauge:  25 G   Block anesthetic:  Lidocaine 2% w/o epi   Block injection procedure:  Anatomic landmarks  identified, introduced needle, incremental injection, negative aspiration for blood and anatomic landmarks palpated   Block outcome:  Anesthesia achieved Laceration details:    Location:  Finger   Finger location:  L index finger   Length (cm):  1 Exploration:    Limited defect created (wound extended): no     Hemostasis achieved with:  Direct pressure   Wound exploration: wound explored through full range of motion and entire depth of wound visualized     Contaminated: no   Treatment:    Area cleansed with:  Povidone-iodine and saline   Amount of cleaning:  Standard   Irrigation solution:  Sterile saline   Irrigation method:  Pressure wash   Visualized foreign bodies/material removed: no     Debridement:  None   Undermining:  None   Scar revision: no   Skin repair:    Repair method:  Sutures   Suture size:  5-0   Suture material:  Prolene   Suture technique:  Simple interrupted   Number of sutures:  3 Approximation:    Approximation:  Close Repair type:    Repair type:  Simple Post-procedure details:    Dressing:  Open (no dressing)   Procedure completion:  Tolerated   Medications Ordered in ED Medications  acetaminophen (TYLENOL) tablet 650 mg (has no administration in time range)  lidocaine (XYLOCAINE) 2 % (with pres) injection 300 mg (300 mg Intradermal Given 12/02/20 1931)  Tdap (BOOSTRIX) injection 0.5 mL (0.5 mLs Intramuscular Given 12/02/20 1930)    ED Course  I have reviewed the triage vital signs and the nursing notes.  Pertinent labs & imaging results that were available during my care of the patient were reviewed by me and considered in my medical decision making (see chart for details).    MDM Rules/Calculators/A&P                          Pressure irrigation performed. Wound explored and base of wound visualized in a bloodless field without evidence of foreign body.  Laceration occurred < 8 hours prior to repair which was well tolerated.  Tdap updated.   Pt has  no comorbidities to effect normal wound healing. Pt discharged  without antibiotics.  Discussed  suture home care with patient and answered questions. Pt to follow-up for wound check and suture removal in 7 days; they are to return to the ED sooner for signs of infection. Pt is hemodynamically stable with no complaints prior to dc.     Final Clinical Impression(s) / ED Diagnoses Final diagnoses:  Laceration of left index finger without damage to nail, foreign body presence unspecified, initial encounter    Rx / DC Orders ED Discharge Orders     None        Rayne Du 12/02/20 2023    Linwood Dibbles, MD 12/04/20 [phone]

## 2020-12-02 NOTE — ED Notes (Signed)
Bleeding controlled at present time with the laceration.  Pt. Reports lots of bleeding at time of cut.

## 2020-12-02 NOTE — ED Triage Notes (Signed)
Left 1st digit laceration today, bleeding controlled, superficial in nature.

## 2020-12-18 ENCOUNTER — Other Ambulatory Visit (INDEPENDENT_AMBULATORY_CARE_PROVIDER_SITE_OTHER): Payer: BLUE CROSS/BLUE SHIELD

## 2020-12-18 ENCOUNTER — Other Ambulatory Visit: Payer: Self-pay

## 2020-12-18 DIAGNOSIS — E890 Postprocedural hypothyroidism: Secondary | ICD-10-CM

## 2020-12-18 LAB — T4, FREE: Free T4: 1.21 ng/dL (ref 0.60–1.60)

## 2020-12-18 LAB — TSH: TSH: 3.11 u[IU]/mL (ref 0.35–5.50)

## 2020-12-19 NOTE — Progress Notes (Signed)
Please call to let patient know that the lab results are normal and to continue same dose but make sure she follows up as scheduled in December

## 2021-04-22 ENCOUNTER — Other Ambulatory Visit: Payer: Self-pay | Admitting: Endocrinology

## 2021-04-22 DIAGNOSIS — E890 Postprocedural hypothyroidism: Secondary | ICD-10-CM

## 2021-04-25 ENCOUNTER — Other Ambulatory Visit (INDEPENDENT_AMBULATORY_CARE_PROVIDER_SITE_OTHER): Payer: BLUE CROSS/BLUE SHIELD

## 2021-04-25 ENCOUNTER — Other Ambulatory Visit: Payer: Self-pay

## 2021-04-25 DIAGNOSIS — E890 Postprocedural hypothyroidism: Secondary | ICD-10-CM | POA: Diagnosis not present

## 2021-04-25 LAB — T4, FREE: Free T4: 0.72 ng/dL (ref 0.60–1.60)

## 2021-04-25 LAB — TSH: TSH: 17.52 u[IU]/mL — ABNORMAL HIGH (ref 0.35–5.50)

## 2021-04-30 ENCOUNTER — Ambulatory Visit: Payer: BLUE CROSS/BLUE SHIELD | Admitting: Endocrinology

## 2021-05-22 ENCOUNTER — Ambulatory Visit (INDEPENDENT_AMBULATORY_CARE_PROVIDER_SITE_OTHER): Payer: BLUE CROSS/BLUE SHIELD | Admitting: Endocrinology

## 2021-05-22 ENCOUNTER — Encounter: Payer: Self-pay | Admitting: Endocrinology

## 2021-05-22 ENCOUNTER — Other Ambulatory Visit: Payer: Self-pay

## 2021-05-22 VITALS — BP 134/92 | HR 72 | Ht 60.0 in | Wt 128.4 lb

## 2021-05-22 DIAGNOSIS — I10 Essential (primary) hypertension: Secondary | ICD-10-CM

## 2021-05-22 DIAGNOSIS — E890 Postprocedural hypothyroidism: Secondary | ICD-10-CM | POA: Diagnosis not present

## 2021-05-22 MED ORDER — TRIAMTERENE-HCTZ 37.5-25 MG PO TABS: 1.0000 | ORAL_TABLET | Freq: Every day | ORAL | 0 refills | Status: DC

## 2021-05-22 MED ORDER — LEVOTHYROXINE SODIUM 150 MCG PO TABS: 150.0000 ug | ORAL_TABLET | Freq: Every day | ORAL | 1 refills | Status: DC

## 2021-05-22 NOTE — Progress Notes (Signed)
Patient ID: Carol Kelley, female   DOB: [birth year], 36 y.o.   MRN: [id number]             Reason for Appointment: Endocrinology follow-up   History of Present Illness:   PROBLEM 1: Hypothyroidism was first diagnosed  at age 71  At the time of diagnosis patient had thyroidectomy for Graves' disease   If she is irregular with her thyroid supplement she starts having symptoms of  fatigue She tends to have long-term cold sensitivity also      The patient has been previously treated with  doses of levothyroxine between 125 and 175 mcg   RECENT history:  She had been on 150 mcg levothyroxine in 2020 At that time she had been diagnosed to have early pregnancy but did not follow-up since she moved out of town  She thinks she may have been taking 175 mcg levothyroxine later in pregnancy   More recently has been getting on 50 mcg of thyroxine prescription She was out of her levothyroxine prior to her follow-up in 7/22, then she was complaining of feeling moody and somewhat tired with some cold intolerance TSH was 93  Subsequently she was taking her levothyroxine regularly and TSH was back down to 3.1  She had labs done in December 2022 but she did not come for office visit She thinks that likely she had been missing some doses of her levothyroxine as she would forget in the morning to take it  She takes generic levothyroxine supplement  Only in the last few days she has taken her levothyroxine consistently  TSH last month was high at 17.5  Patient's weight history is as follows:  Wt Readings from Last 3 Encounters:  05/22/21 128 lb 6.4 oz (58.2 kg)  12/02/20 137 lb 3.2 oz (62.2 kg)  11/27/20 137 lb 3.2 oz (62.2 kg)    Thyroid function results have been as follows:  Lab Results  Component Value Date   TSH 17.52 (H) 04/25/2021   TSH 3.11 12/18/2020   TSH 92.98 (H) 10/28/2020   TSH 2.09 02/17/2019   FREET4 0.72 04/25/2021   FREET4 1.21 12/18/2020   FREET4 0.42 (L)  10/28/2020   FREET4 1.30 02/17/2019     Past Medical History:  Diagnosis Date   Anemia    Graves disease    Hayfever    Thyroid disease     Past Surgical History:  Procedure Laterality Date   CESAREAN SECTION     THYROIDECTOMY      Family History  Problem Relation Age of Onset   Hypertension Mother    Hypertension Maternal Grandmother    Diabetes Maternal Grandfather    Diabetes Paternal Grandfather    Thyroid disease Other     Social History:  reports that she has been smoking cigars. She has never used smokeless tobacco. She reports current alcohol use. She reports current drug use. Drug: Marijuana.  Allergies:  No Known Allergies  Allergies as of 05/22/2021   No Known Allergies      Medication List        Accurate as of May 22, 2021  4:43 PM. If you have any questions, ask your nurse or doctor.          levothyroxine 150 MCG tablet Commonly known as: SYNTHROID Take 1 tablet (150 mcg total) by mouth daily.   triamterene-hydrochlorothiazide 37.5-25 MG tablet Commonly known as: MAXZIDE-25 Take 1 tablet by mouth daily. Started by: Reather Littler, MD  Review of Systems   No history of hypertension except during pregnancy Not checking blood pressure at home She has not established with a PCP  Blood pressure consistently high in the office today  BP Readings from Last 3 Encounters:  05/22/21 (!) 148/92  12/02/20 (!) 178/31  11/27/20 140/78   History of chronic anemia and usually cannot take iron supplements because of constipation  Lab Results  Component Value Date   HGB 11.5 (L) 12/31/2018    She has history of hirsutism        Examination:    BP (!) 148/92    Pulse 72    Ht 5' (1.524 m)    Wt 128 lb 6.4 oz (58.2 kg)    SpO2 99%    BMI 25.08 kg/m      Assessment:  HYPOTHYROIDISM, postsurgical since age 33  She has been on levothyroxine  150 mcg with usually a stable dose However Prior to her labs last time she had  likely missed a few doses  More recently she thinks she has set up a routine when she can take her levothyroxine regularly before breakfast She did not complain of any symptoms of hypothyroidism currently However TSH last month was significantly high at 17.5  Also she thinks she has lost weight but not clear why, recently has started exercise also  HYPERTENSION: Blood pressure is significantly and persistently high with a family history She is currently not on any treatment already being seen by PCP    PLAN:   For now we will keep her on 150 mcg She will try to establish a routine to take her levothyroxine consistently every morning  She will come back in about 6 weeks to have a TSH done and not change the dose as yet  Start triamterene HCTZ 25 mg, half tablet daily for hypertension This should be effective but also she needs to modify her diet with cutting back high sodium food General information on hypertension management given  She will establish with a PCP and list of PCP doctors given  Follow-up in 4 months   Reather Littler 05/22/2021, 4:43 PM     Note: This office note was prepared with Dragon voice recognition system technology. Any transcriptional errors that result from this process are unintentional.

## 2021-05-22 NOTE — Progress Notes (Signed)
8

## 2021-05-29 DIAGNOSIS — R87610 Atypical squamous cells of undetermined significance on cytologic smear of cervix (ASC-US): Secondary | ICD-10-CM | POA: Insufficient documentation

## 2021-05-29 DIAGNOSIS — B977 Papillomavirus as the cause of diseases classified elsewhere: Secondary | ICD-10-CM | POA: Insufficient documentation

## 2021-05-29 LAB — RESULTS CONSOLE HPV: CHL HPV: POSITIVE

## 2021-05-29 LAB — HM PAP SMEAR: HM Pap smear: ABNORMAL

## 2021-07-03 ENCOUNTER — Other Ambulatory Visit: Payer: BLUE CROSS/BLUE SHIELD

## 2021-09-23 ENCOUNTER — Other Ambulatory Visit: Payer: BLUE CROSS/BLUE SHIELD

## 2021-09-25 ENCOUNTER — Ambulatory Visit: Payer: BLUE CROSS/BLUE SHIELD | Admitting: Endocrinology

## 2021-12-24 ENCOUNTER — Encounter (HOSPITAL_BASED_OUTPATIENT_CLINIC_OR_DEPARTMENT_OTHER): Payer: Self-pay | Admitting: Emergency Medicine

## 2021-12-24 ENCOUNTER — Emergency Department (HOSPITAL_BASED_OUTPATIENT_CLINIC_OR_DEPARTMENT_OTHER)
Admission: EM | Admit: 2021-12-24 | Discharge: 2021-12-24 | Disposition: A | Discharge status: Home / Self Care | Payer: BLUE CROSS/BLUE SHIELD | Attending: Emergency Medicine | Admitting: Emergency Medicine

## 2021-12-24 ENCOUNTER — Other Ambulatory Visit: Payer: Self-pay

## 2021-12-24 ENCOUNTER — Emergency Department (HOSPITAL_BASED_OUTPATIENT_CLINIC_OR_DEPARTMENT_OTHER): Payer: BLUE CROSS/BLUE SHIELD

## 2021-12-24 DIAGNOSIS — O209 Hemorrhage in early pregnancy, unspecified: Secondary | ICD-10-CM | POA: Diagnosis present

## 2021-12-24 DIAGNOSIS — Z3A09 9 weeks gestation of pregnancy: Secondary | ICD-10-CM | POA: Insufficient documentation

## 2021-12-24 DIAGNOSIS — B9689 Other specified bacterial agents as the cause of diseases classified elsewhere: Secondary | ICD-10-CM

## 2021-12-24 DIAGNOSIS — N939 Abnormal uterine and vaginal bleeding, unspecified: Secondary | ICD-10-CM

## 2021-12-24 LAB — CBC WITH DIFFERENTIAL/PLATELET
Abs Immature Granulocytes: 0.02 10*3/uL (ref 0.00–0.07)
Basophils Absolute: 0.1 10*3/uL (ref 0.0–0.1)
Basophils Relative: 1 %
Eosinophils Absolute: 0.1 10*3/uL (ref 0.0–0.5)
Eosinophils Relative: 2 %
HCT: 26.2 % — ABNORMAL LOW (ref 36.0–46.0)
Hemoglobin: 8.5 g/dL — ABNORMAL LOW (ref 12.0–15.0)
Immature Granulocytes: 0 %
Lymphocytes Relative: 33 %
Lymphs Abs: 1.9 10*3/uL (ref 0.7–4.0)
MCH: 24.1 pg — ABNORMAL LOW (ref 26.0–34.0)
MCHC: 32.4 g/dL (ref 30.0–36.0)
MCV: 74.4 fL — ABNORMAL LOW (ref 80.0–100.0)
Monocytes Absolute: 0.4 10*3/uL (ref 0.1–1.0)
Monocytes Relative: 7 %
Neutro Abs: 3.2 10*3/uL (ref 1.7–7.7)
Neutrophils Relative %: 57 %
Platelets: 237 10*3/uL (ref 150–400)
RBC: 3.52 MIL/uL — ABNORMAL LOW (ref 3.87–5.11)
RDW: 16.9 % — ABNORMAL HIGH (ref 11.5–15.5)
WBC: 5.6 10*3/uL (ref 4.0–10.5)
nRBC: 0 % (ref 0.0–0.2)

## 2021-12-24 LAB — COMPREHENSIVE METABOLIC PANEL
ALT: 15 U/L (ref 0–44)
AST: 21 U/L (ref 15–41)
Albumin: 3.6 g/dL (ref 3.5–5.0)
Alkaline Phosphatase: 49 U/L (ref 38–126)
Anion gap: 5 (ref 5–15)
BUN: 7 mg/dL (ref 6–20)
CO2: 26 mmol/L (ref 22–32)
Calcium: 8.4 mg/dL — ABNORMAL LOW (ref 8.9–10.3)
Chloride: 109 mmol/L (ref 98–111)
Creatinine, Ser: 0.62 mg/dL (ref 0.44–1.00)
GFR, Estimated: >60 mL/min (ref >60)
Glucose, Bld: 87 mg/dL (ref 70–99)
Potassium: 3.8 mmol/L (ref 3.5–5.1)
Sodium: 140 mmol/L (ref 135–145)
Total Bilirubin: 0.6 mg/dL (ref 0.3–1.2)
Total Protein: 6.9 g/dL (ref 6.5–8.1)

## 2021-12-24 LAB — HEMOGLOBIN AND HEMATOCRIT, BLOOD
HCT: 23.6 % — ABNORMAL LOW (ref 36.0–46.0)
Hemoglobin: 7.7 g/dL — ABNORMAL LOW (ref 12.0–15.0)

## 2021-12-24 LAB — URINALYSIS, MICROSCOPIC (REFLEX): WBC, UA: NONE SEEN WBC/hpf (ref 0–5)

## 2021-12-24 LAB — URINALYSIS, ROUTINE W REFLEX MICROSCOPIC
Bilirubin Urine: NEGATIVE
Glucose, UA: NEGATIVE mg/dL
Ketones, ur: NEGATIVE mg/dL
Leukocytes,Ua: NEGATIVE
Nitrite: NEGATIVE
Protein, ur: NEGATIVE mg/dL
Specific Gravity, Urine: 1.015 (ref 1.005–1.030)
pH: 7 (ref 5.0–8.0)

## 2021-12-24 LAB — WET PREP, GENITAL
Sperm: NONE SEEN
Trich, Wet Prep: NONE SEEN
WBC, Wet Prep HPF POC: <10 (ref <10)
Yeast Wet Prep HPF POC: NONE SEEN

## 2021-12-24 LAB — HCG, SERUM, QUALITATIVE: Preg, Serum: POSITIVE — AB

## 2021-12-24 LAB — HCG, QUANTITATIVE, PREGNANCY: hCG, Beta Chain, Quant, S: 1615 m[IU]/mL — ABNORMAL HIGH (ref <5)

## 2021-12-24 MED ORDER — METRONIDAZOLE 500 MG PO TABS: 500.0000 mg | ORAL_TABLET | Freq: Two times a day (BID) | ORAL | 0 refills | Status: DC

## 2021-12-24 NOTE — ED Triage Notes (Addendum)
PT had a "medical" (medications) abortion on 7/26 and the vaginal bleeding had tapered off, but became heavy 2d ago; sts she worked out today and felt a gush; she went through 2 pads in 30 minutes, passed a large clot and bleeding has slowed again; reports light cramping; also wants to be checked for BV

## 2021-12-24 NOTE — ED Notes (Signed)
Discharge instructions reviewed with patient. Patient questions answered and opportunity for education reviewed. Patient voices understanding of discharge instructions with no further questions. Patient ambulatory with steady gait to lobby.  

## 2021-12-24 NOTE — ED Provider Notes (Signed)
[facility] EMERGENCY DEPARTMENT Provider Note   CSN: [id number] Arrival date & time: 12/24/21  1357     History  Chief Complaint  Patient presents with   Vaginal Bleeding    Carol Kelley is a 36 y.o. female.  36 year old female G4, P2 with 1 miscarriage and 1 abortion presents with concern for heavy vaginal bleeding.  Patient reports taking medication to induce abortion on December 03, 2021 at approximately [redacted] weeks gestation as prescribed by her OB.  States that she had cramping and passage of clots and tissue at that time, bleeding had improved however returned about 3 days ago.  Described as having a heavy gush of blood followed by clots and then being tapered off.  Experienced another gush of watery blood today which soaked through her heavy pad and to her sofa.  Bleeding has since eased off.  States that she was also passing large clots.  States that she feels weak and fatigued today.  Denies chest pains or shortness of breath.  Patient called her OB and was advised to come to the ER for her report of soaking through 2 pads in 30 minutes although bleeding has subsided at this point.       Home Medications Prior to Admission medications   Medication Sig Start Date End Date Taking? Authorizing Provider  levothyroxine (SYNTHROID) 150 MCG tablet Take 1 tablet (150 mcg total) by mouth daily. 05/22/21   Reather Littler, MD  triamterene-hydrochlorothiazide (MAXZIDE-25) 37.5-25 MG tablet Take 1 tablet by mouth daily. 05/22/21   Reather Littler, MD      Allergies    Patient has no known allergies.    Review of Systems   Review of Systems Negative except as per HPI Physical Exam Updated Vital Signs BP (!) 156/100   Pulse 79   Temp 98.9 F (37.2 C) (Oral)   Resp 18   Ht 5' (1.524 m)   Wt 63 kg   SpO2 100%   BMI 27.15 kg/m  Physical Exam Vitals and nursing note reviewed.  Constitutional:      General: She is not in acute distress.    Appearance: She is well-developed. She  is not diaphoretic.  HENT:     Head: Normocephalic and atraumatic.  Cardiovascular:     Rate and Rhythm: Normal rate and regular rhythm.     Heart sounds: Normal heart sounds.  Pulmonary:     Effort: Pulmonary effort is normal.     Breath sounds: Normal breath sounds.  Abdominal:     Palpations: Abdomen is soft.     Tenderness: There is no abdominal tenderness. There is no right CVA tenderness or left CVA tenderness.  Skin:    General: Skin is warm and dry.     Findings: No erythema or rash.  Neurological:     Mental Status: She is alert and oriented to person, place, and time.  Psychiatric:        Behavior: Behavior normal.     ED Results / Procedures / Treatments   Labs (all labs ordered are listed, but only abnormal results are displayed) Labs Reviewed  CBC WITH DIFFERENTIAL/PLATELET - Abnormal; Notable for the following components:      Result Value   RBC 3.52 (*)    Hemoglobin 8.5 (*)    HCT 26.2 (*)    MCV 74.4 (*)    MCH 24.1 (*)    RDW 16.9 (*)    All other components within normal limits  COMPREHENSIVE  METABOLIC PANEL - Abnormal; Notable for the following components:   Calcium 8.4 (*)    All other components within normal limits  URINALYSIS, ROUTINE W REFLEX MICROSCOPIC - Abnormal; Notable for the following components:   Hgb urine dipstick MODERATE (*)    All other components within normal limits  HCG, SERUM, QUALITATIVE - Abnormal; Notable for the following components:   Preg, Serum POSITIVE (*)    All other components within normal limits  URINALYSIS, MICROSCOPIC (REFLEX) - Abnormal; Notable for the following components:   Bacteria, UA RARE (*)    All other components within normal limits  WET PREP, GENITAL  HCG, QUANTITATIVE, PREGNANCY    EKG None  Radiology No results found.  Procedures Procedures    Medications Ordered in ED Medications - No data to display  ED Course/ Medical Decision Making/ A&P                           Medical  Decision Making Amount and/or Complexity of Data Reviewed Labs: ordered. Radiology: ordered.   This patient presents to the ED for concern of heavy vaginal bleeding after medication induced abortion at [redacted] weeks gestation on December 03, 2021, this involves an extensive number of treatment options, and is a complaint that carries with it a high risk of complications and morbidity.  The differential diagnosis includes but not limited to retained products of conception, missed AB, new pregnancy, ectopic pregnancy   Co morbidities that complicate the patient evaluation  G4 P2, prior miscarriage, 1 abortion.  Admitted to the hospital August 21, 2019 to May 1921 for preeclampsia, ultimately treated with C-section.  Diagnosed with acute on chronic anemia with hemoglobins ranging from 5.5-9.4.  Was transfused during that admission.   Additional history obtained:  External records from outside source obtained and reviewed including record for admission from April 12/21 to 09/27/2019 to [place] [facility] for preeclampsia   Lab Tests:  I Ordered, and personally interpreted labs.  The pertinent results include: Urine pregnancy test positive, add on quantitative test pending.  CBC with hemoglobin of 8.5.  This is compared to a not significantly different from prior on file in care everywhere from hospitalization in 2021 where hemoglobin range from 5.5-9.4.  CMP without significant findings.  Urinalysis contaminated with blood otherwise no evidence of urinary tract infection.   Imaging Studies ordered:  I ordered imaging studies including pelvic ultrasound I independently visualized and interpreted imaging which showed results pending at time of signout I agree with the radiologist interpretation   Problem List / ED Course / Critical interventions / Medication management  36 year old female with concern for heavy vaginal bleeding after taking abortion pill on December 03, 2021 at [redacted] weeks gestation.  States that at  that time she did have passage of clots and tissue, bleeding and cramping which eased off.  States about 3 days ago she had an episode of heavy bleeding with passage of clots.  Bleeding subsided and then returned today.  States that she passed a large amount of watery blood with additional clots soaking through 2 pads within 30 minutes.  Patient called her OB who deferred care to the emergency room.  Patient reports bleeding has eased off and is minimal at this time.  Abdomen is soft and nontender.  At time of signout, patient is awaiting her pelvic ultrasound and additional lab work. I have reviewed the patients home medicines and have made adjustments as needed   Social  Determinants of Health:  Has OB locally   Test / Admission - Considered:  Disposition pending at time of signout         Final Clinical Impression(s) / ED Diagnoses Final diagnoses:  None    Rx / DC Orders ED Discharge Orders     None         Jeannie Fend, PA-C 12/24/21 1905    Melene Plan, DO 12/24/21 1914

## 2021-12-24 NOTE — Discharge Instructions (Addendum)
It was a pleasure taking care of you today.  As discussed, I spoke to the OB/GYN on-call for [facility] for [facility].  They recommend calling the office tomorrow morning to schedule an appointment for tomorrow.  Do not eat or drink anything after midnight.  Return to the ER for new or worsening symptoms.  Your wet prep was positive for BV. I am sending you home with an antibiotic.  Take as prescribed and finish all antibiotics.  Do not drink alcohol while on this antibiotic.

## 2021-12-24 NOTE — ED Provider Notes (Signed)
Care assumed from Army Melia, PA-C at shift change.  See her note for full HPI.  In short, patient is a 60 G4, P2 female who presents to the ED due to vaginal bleeding after a medication induced abortion on 12/03/2021.  Patient notes she was roughly [redacted] weeks pregnant.  Patient mitts to intermittent clots of blood.  Patient notes bleeding has improved.  Plan from previous provider, follow-up on ultrasound to rule out retained products.  Repeat H&H to ensure hemoglobin is stable.  Physical Exam  BP (!) 156/100   Pulse 79   Temp 98.9 F (37.2 C) (Oral)   Resp 18   Ht 5' (1.524 m)   Wt 63 kg   SpO2 100%   BMI 27.15 kg/m   Physical Exam Vitals and nursing note reviewed.  Constitutional:      General: She is not in acute distress.    Appearance: She is not ill-appearing.  HENT:     Head: Normocephalic.  Eyes:     Pupils: Pupils are equal, round, and reactive to light.  Cardiovascular:     Rate and Rhythm: Normal rate and regular rhythm.     Pulses: Normal pulses.     Heart sounds: Normal heart sounds. No murmur heard.    No friction rub. No gallop.  Pulmonary:     Effort: Pulmonary effort is normal.     Breath sounds: Normal breath sounds.  Abdominal:     General: Abdomen is flat. There is no distension.     Palpations: Abdomen is soft.     Tenderness: There is no abdominal tenderness. There is no guarding or rebound.  Musculoskeletal:        General: Normal range of motion.     Cervical back: Neck supple.  Skin:    General: Skin is warm and dry.  Neurological:     General: No focal deficit present.     Mental Status: She is alert.  Psychiatric:        Mood and Affect: Mood normal.        Behavior: Behavior normal.     Procedures  Procedures  ED Course / MDM   Clinical Course as of 12/24/21 2237  Wed Dec 24, 2021  2237 Clue Cells Wet Prep HPF POC(!): PRESENT [CA]    Clinical Course User Index [CA] Mannie Stabile, PA-C   Medical Decision Making Amount  and/or Complexity of Data Reviewed Labs: ordered. Decision-making details documented in ED Course. Radiology: ordered and independent interpretation performed. Decision-making details documented in ED Course.  Care assumed from Archie Balboa, PA-C at shift change pending ultrasound.  See her note for full MDM.   36 year old female presents to the ED due to vaginal bleeding after a medication induced abortion on 12/03/2021 at [redacted] weeks gestation.  Ultrasound showed possible retained products.  CBC significant for hemoglobin 8.5 with no recent baseline.  Patient remains hemodynamically stable.  She notes she is only currently bleeding while wiping.  CMP reassuring.  Normal renal function.  No major electrolyte derangements.  9:12 PM Discussed with Dr. Vincente Poli with OBGYN who recommends patient call the office tomorrow AM for an appointment for a possible D&E. NPO after midnight.   10:14 PM Discussed with Dr. Vincente Poli again given drop in H&H. She notes since patient states she is barely bleeding and she is hemodynamically stable, she is stable for discharge with follow-up tomorrow.  Considered hospitalization however, given patient is hemodynamically stable with decreased vaginal bleeding feel she  is safe for discharge.  Strict ED precautions discussed with patient. Patient states understanding and agrees to plan. Patient discharged home in no acute distress and stable vitals.       Jesusita Oka 12/24/21 2227    Melene Plan, DO 12/24/21 2247

## 2022-01-05 ENCOUNTER — Other Ambulatory Visit: Payer: Self-pay | Admitting: Endocrinology

## 2022-01-05 DIAGNOSIS — E890 Postprocedural hypothyroidism: Secondary | ICD-10-CM

## 2022-04-20 ENCOUNTER — Ambulatory Visit
Admission: EM | Admit: 2022-04-20 | Discharge: 2022-04-20 | Discharge status: Against Medical Advice | Payer: BLUE CROSS/BLUE SHIELD

## 2022-06-22 ENCOUNTER — Telehealth: Payer: Self-pay | Admitting: Endocrinology

## 2022-06-22 DIAGNOSIS — E890 Postprocedural hypothyroidism: Secondary | ICD-10-CM

## 2022-06-22 MED ORDER — LEVOTHYROXINE SODIUM 150 MCG PO TABS: 150.0000 ug | ORAL_TABLET | Freq: Every day | ORAL | 0 refills | Status: DC

## 2022-06-22 NOTE — Telephone Encounter (Signed)
New message    1. Which medications need to be refilled? (please list name of each medication and dose if known) levothyroxine (SYNTHROID) 150 MCG tablet   2. Which pharmacy/location (including street and city if local pharmacy) is medication to be sent to?[facility] on [address]   3. Do they need a 30 day or 90 day supply? 90 day supply

## 2022-06-22 NOTE — Telephone Encounter (Signed)
Script sent  

## 2022-07-08 ENCOUNTER — Other Ambulatory Visit (HOSPITAL_COMMUNITY)
Admission: RE | Admit: 2022-07-08 | Discharge: 2022-07-08 | Disposition: A | Discharge status: Home / Self Care | Payer: BLUE CROSS/BLUE SHIELD | Source: Ambulatory Visit | Attending: Nurse Practitioner | Admitting: Nurse Practitioner

## 2022-07-08 ENCOUNTER — Encounter: Payer: Self-pay | Admitting: Nurse Practitioner

## 2022-07-08 ENCOUNTER — Ambulatory Visit: Payer: BLUE CROSS/BLUE SHIELD | Admitting: Nurse Practitioner

## 2022-07-08 VITALS — BP 136/88 | HR 77 | Temp 98.6°F | Ht 60.0 in | Wt 146.6 lb

## 2022-07-08 DIAGNOSIS — F419 Anxiety disorder, unspecified: Secondary | ICD-10-CM | POA: Diagnosis not present

## 2022-07-08 DIAGNOSIS — N898 Other specified noninflammatory disorders of vagina: Secondary | ICD-10-CM | POA: Insufficient documentation

## 2022-07-08 DIAGNOSIS — D649 Anemia, unspecified: Secondary | ICD-10-CM | POA: Diagnosis not present

## 2022-07-08 DIAGNOSIS — E890 Postprocedural hypothyroidism: Secondary | ICD-10-CM

## 2022-07-08 DIAGNOSIS — R5383 Other fatigue: Secondary | ICD-10-CM

## 2022-07-08 DIAGNOSIS — F32A Depression, unspecified: Secondary | ICD-10-CM | POA: Insufficient documentation

## 2022-07-08 DIAGNOSIS — R030 Elevated blood-pressure reading, without diagnosis of hypertension: Secondary | ICD-10-CM | POA: Diagnosis not present

## 2022-07-08 NOTE — Assessment & Plan Note (Signed)
Chronic, ongoing.  She has been having an increase in stress and anxiety especially related to work.  Her PHQ-9 is a 12 and her GAD-7 is a 13.  She denies SI/HI.  Will place a referral to therapy.

## 2022-07-08 NOTE — Assessment & Plan Note (Signed)
Chronic, ongoing.  She is currently following with Dr. Dwyane Dee with endocrinology.  She is taking levothyroxine 150 mcg daily.  Will check TSH and free T4 today.

## 2022-07-08 NOTE — Assessment & Plan Note (Signed)
Pressure is slightly elevated today at 136/88.  Recommend limiting salt in her diet and increasing exercise as able.

## 2022-07-08 NOTE — Assessment & Plan Note (Signed)
She has a history of anemia, will check CBC and iron panel today.

## 2022-07-08 NOTE — Patient Instructions (Signed)
It was great to see you!  We are checking your labs today and will let you know the results via mychart/phone.   I will send in metrogel if you test positive for BV.   Let's follow-up in 1 year, sooner if you have concerns.  If a referral was placed today, you will be contacted for an appointment. Please note that routine referrals can sometimes take up to 3-4 weeks to process. Please call our office if you haven't heard anything after this time frame.  Take care,  Vance Peper, NP

## 2022-07-08 NOTE — Progress Notes (Signed)
New Patient Visit  BP 136/88 (BP Location: Right Arm, Cuff Size: Normal)   Pulse 77   Temp 98.6 F (37 C)   Ht 5' (1.524 m)   Wt 146 lb 9.6 oz (66.5 kg)   LMP 06/16/2022 (Exact Date)   SpO2 99%   BMI 28.63 kg/m    Subjective:    Patient ID: Carol Kelley, female    DOB: [birth year], 37 y.o.   MRN: [id number]  CC: Chief Complaint  Patient presents with   Establish Care    NP. Est Care, concerns with vaginal odor, discuss lab work and therapy    HPI: AMBOR HRNCIR is a 37 y.o. female presents for new patient visit to establish care.  Introduced to Designer, jewellery role and practice setting.  All questions answered.  Discussed provider/patient relationship and expectations.  She has been experiencing vaginal odor. She notices it more after her periods. She has slight white discharge. She denies itching. She has a history of BV.   She has a history of Graves' disease which required thyroidectomy.  She is currently following with endocrine, Dr. Dwyane Dee.  She takes levothyroxine levothyroxine 150 mcg daily.  She has a history anxiety and endorses stress at work.  She feels feels chest tightness with stress.  She was seeing a therapist, however does not have one now.  She would be interested in setting this up again.  She denies SI/HI.     07/08/2022    2:08 PM  Depression screen PHQ 2/9  Decreased Interest 2  Down, Depressed, Hopeless 2  PHQ - 2 Score 4  Altered sleeping 2  Tired, decreased energy 3  Change in appetite 0  Feeling bad or failure about yourself  1  Trouble concentrating 2  Moving slowly or fidgety/restless 0  Suicidal thoughts 0  PHQ-9 Score 12  Difficult doing work/chores Somewhat difficult      07/08/2022    2:08 PM  GAD 7 : Generalized Anxiety Score  Nervous, Anxious, on Edge 3  Control/stop worrying 2  Worry too much - different things 3  Trouble relaxing 1  Restless 1  Easily annoyed or irritable 3  Afraid - awful might happen 0  Total  GAD 7 Score 13  Anxiety Difficulty Somewhat difficult    Past Medical History:  Diagnosis Date   Anemia    Graves disease    Hayfever    Pre-eclampsia    Thyroid disease     Past Surgical History:  Procedure Laterality Date   CESAREAN SECTION     x2   THERAPEUTIC ABORTION     THYROIDECTOMY      Family History  Problem Relation Age of Onset   Hypertension Mother    Cancer Father        prostate   Hypertension Maternal Grandmother    Diabetes Maternal Grandfather    Diabetes Paternal Grandfather    Thyroid disease Other      Social History   Tobacco Use   Smoking status: Former    Types: Cigars    Quit date: 04/24/2022    Years since quitting: 0.2   Smokeless tobacco: Never  Vaping Use   Vaping Use: Never used  Substance Use Topics   Alcohol use: Not Currently    Comment: occ   Drug use: Not Currently    Types: Marijuana    Current Outpatient Medications on File Prior to Visit  Medication Sig Dispense Refill   levothyroxine (SYNTHROID) 150 MCG  tablet Take 1 tablet (150 mcg total) by mouth daily. 90 tablet 0   Multiple Vitamin (MULTIVITAMIN) tablet Take 1 tablet by mouth daily.     No current facility-administered medications on file prior to visit.     Review of Systems  Constitutional:  Positive for fatigue. Negative for fever.  HENT: Negative.    Eyes:  Positive for visual disturbance (wears glasses, just had eye exam last week).  Respiratory: Negative.    Cardiovascular: Negative.   Gastrointestinal:  Positive for constipation. Negative for abdominal pain, diarrhea, nausea and vomiting.  Genitourinary:  Positive for vaginal discharge. Negative for dysuria and frequency.  Musculoskeletal: Negative.   Skin: Negative.   Neurological:  Positive for headaches. Negative for dizziness.  Psychiatric/Behavioral:  Positive for dysphoric mood. The patient is nervous/anxious.       Objective:    BP 136/88 (BP Location: Right Arm, Cuff Size: Normal)    Pulse 77   Temp 98.6 F (37 C)   Ht 5' (1.524 m)   Wt 146 lb 9.6 oz (66.5 kg)   LMP 06/16/2022 (Exact Date)   SpO2 99%   BMI 28.63 kg/m   Wt Readings from Last 3 Encounters:  07/08/22 146 lb 9.6 oz (66.5 kg)  12/24/21 139 lb (63 kg)  05/22/21 128 lb 6.4 oz (58.2 kg)    BP Readings from Last 3 Encounters:  07/08/22 136/88  12/24/21 (!) 130/100  05/22/21 (!) 134/92    Physical Exam Vitals and nursing note reviewed.  Constitutional:      General: She is not in acute distress.    Appearance: Normal appearance.  HENT:     Head: Normocephalic and atraumatic.     Right Ear: Tympanic membrane, ear canal and external ear normal.     Left Ear: Tympanic membrane, ear canal and external ear normal.  Eyes:     Conjunctiva/sclera: Conjunctivae normal.  Cardiovascular:     Rate and Rhythm: Normal rate and regular rhythm.     Pulses: Normal pulses.     Heart sounds: Normal heart sounds.  Pulmonary:     Effort: Pulmonary effort is normal.     Breath sounds: Normal breath sounds.  Abdominal:     Palpations: Abdomen is soft.     Tenderness: There is no abdominal tenderness.  Musculoskeletal:        General: Normal range of motion.     Cervical back: Normal range of motion and neck supple.     Right lower leg: No edema.     Left lower leg: No edema.  Lymphadenopathy:     Cervical: No cervical adenopathy.  Skin:    General: Skin is warm and dry.  Neurological:     General: No focal deficit present.     Mental Status: She is alert and oriented to person, place, and time.     Cranial Nerves: No cranial nerve deficit.     Coordination: Coordination normal.     Gait: Gait normal.  Psychiatric:        Mood and Affect: Mood normal.        Behavior: Behavior normal.        Thought Content: Thought content normal.        Judgment: Judgment normal.        Assessment & Plan:   Problem List Items Addressed This Visit       Endocrine   Postsurgical hypothyroidism - Primary  (Chronic)    Chronic, ongoing.  She is currently  following with Dr. Dwyane Dee with endocrinology.  She is taking levothyroxine 150 mcg daily.  Will check TSH and free T4 today.      Relevant Orders   TSH   T4, free     Other   Elevated blood pressure reading    Pressure is slightly elevated today at 136/88.  Recommend limiting salt in her diet and increasing exercise as able.      Anxiety and depression    Chronic, ongoing.  She has been having an increase in stress and anxiety especially related to work.  Her PHQ-9 is a 12 and her GAD-7 is a 13.  She denies SI/HI.  Will place a referral to therapy.      Relevant Orders   Ambulatory referral to Psychology   Anemia    She has a history of anemia, will check CBC and iron panel today.      Relevant Orders   CBC with Differential/Platelet   Iron, TIBC and Ferritin Panel   Other Visit Diagnoses     Vaginal odor       Will check swab for BV and yeast. Treat based on results.   Relevant Orders   Cervicovaginal ancillary only   Fatigue, unspecified type       Will check CMP, CBC, TSH, vitamin D, vitamin B12, and iron panel today. Encourage regular exercise and sleep.   Relevant Orders   CBC with Differential/Platelet   Comprehensive metabolic panel   VITAMIN D 25 Hydroxy (Vit-D Deficiency, Fractures)   Vitamin B12        Follow up plan: Return in about 1 year (around 07/09/2023) for CPE.

## 2022-07-09 LAB — IRON,TIBC AND FERRITIN PANEL
%SAT: 24 % (calc) (ref 16–45)
Ferritin: 23 ng/mL (ref 16–154)
Iron: 79 ug/dL (ref 40–190)
TIBC: 335 mcg/dL (calc) (ref 250–450)

## 2022-07-09 LAB — COMPREHENSIVE METABOLIC PANEL
ALT: 19 U/L (ref 0–35)
AST: 27 U/L (ref 0–37)
Albumin: 4 g/dL (ref 3.5–5.2)
Alkaline Phosphatase: 77 U/L (ref 39–117)
BUN: 11 mg/dL (ref 6–23)
CO2: 26 mEq/L (ref 19–32)
Calcium: 9.6 mg/dL (ref 8.4–10.5)
Chloride: 105 mEq/L (ref 96–112)
Creatinine, Ser: 0.76 mg/dL (ref 0.40–1.20)
GFR: 101.29 mL/min (ref >60.00)
Glucose, Bld: 77 mg/dL (ref 70–99)
Potassium: 4.5 mEq/L (ref 3.5–5.1)
Sodium: 140 mEq/L (ref 135–145)
Total Bilirubin: 0.5 mg/dL (ref 0.2–1.2)
Total Protein: 6.9 g/dL (ref 6.0–8.3)

## 2022-07-09 LAB — CBC WITH DIFFERENTIAL/PLATELET
Basophils Absolute: 0.1 10*3/uL (ref 0.0–0.1)
Basophils Relative: 1.4 % (ref 0.0–3.0)
Eosinophils Absolute: 0.7 10*3/uL (ref 0.0–0.7)
Eosinophils Relative: 14.5 % — ABNORMAL HIGH (ref 0.0–5.0)
HCT: 35.2 % — ABNORMAL LOW (ref 36.0–46.0)
Hemoglobin: 11.2 g/dL — ABNORMAL LOW (ref 12.0–15.0)
Lymphocytes Relative: 37.2 % (ref 12.0–46.0)
Lymphs Abs: 1.8 10*3/uL (ref 0.7–4.0)
MCHC: 31.9 g/dL (ref 30.0–36.0)
MCV: 72.3 fl — ABNORMAL LOW (ref 78.0–100.0)
Monocytes Absolute: 0.4 10*3/uL (ref 0.1–1.0)
Monocytes Relative: 7.2 % (ref 3.0–12.0)
Neutro Abs: 2 10*3/uL (ref 1.4–7.7)
Neutrophils Relative %: 39.7 % — ABNORMAL LOW (ref 43.0–77.0)
Platelets: 298 10*3/uL (ref 150.0–400.0)
RBC: 4.87 Mil/uL (ref 3.87–5.11)
RDW: 14.7 % (ref 11.5–15.5)
WBC: 4.9 10*3/uL (ref 4.0–10.5)

## 2022-07-09 LAB — TSH: TSH: 15.41 u[IU]/mL — ABNORMAL HIGH (ref 0.35–5.50)

## 2022-07-09 LAB — VITAMIN B12: Vitamin B-12: 403 pg/mL (ref 211–911)

## 2022-07-09 LAB — VITAMIN D 25 HYDROXY (VIT D DEFICIENCY, FRACTURES): VITD: <7 ng/mL — ABNORMAL LOW (ref 30.00–100.00)

## 2022-07-09 LAB — T4, FREE: Free T4: 0.59 ng/dL — ABNORMAL LOW (ref 0.60–1.60)

## 2022-07-10 ENCOUNTER — Encounter: Payer: Self-pay | Admitting: Nurse Practitioner

## 2022-07-10 LAB — CERVICOVAGINAL ANCILLARY ONLY
Bacterial Vaginitis (gardnerella): POSITIVE — AB
Candida Glabrata: NEGATIVE
Candida Vaginitis: NEGATIVE
Comment: NEGATIVE
Comment: NEGATIVE
Comment: NEGATIVE

## 2022-07-10 MED ORDER — VITAMIN D (ERGOCALCIFEROL) 1.25 MG (50000 UNIT) PO CAPS: 50000.0000 [IU] | ORAL_CAPSULE | ORAL | 1 refills | Status: DC

## 2022-07-10 MED ORDER — METRONIDAZOLE 0.75 % VA GEL: 1.0000 | Freq: Every day | VAGINAL | 0 refills | Status: DC

## 2022-07-10 NOTE — Addendum Note (Signed)
Addended by: Vance Peper A on: 07/10/2022 11:53 AM   Modules accepted: Orders

## 2022-07-10 NOTE — Addendum Note (Signed)
Addended by: Vance Peper A on: 07/10/2022 09:10 AM   Modules accepted: Orders

## 2022-08-12 ENCOUNTER — Ambulatory Visit (INDEPENDENT_AMBULATORY_CARE_PROVIDER_SITE_OTHER): Payer: BLUE CROSS/BLUE SHIELD | Admitting: Endocrinology

## 2022-08-12 ENCOUNTER — Encounter: Payer: Self-pay | Admitting: Endocrinology

## 2022-08-12 VITALS — BP 126/84 | HR 86 | Ht 60.0 in | Wt 151.0 lb

## 2022-08-12 DIAGNOSIS — R030 Elevated blood-pressure reading, without diagnosis of hypertension: Secondary | ICD-10-CM

## 2022-08-12 DIAGNOSIS — L680 Hirsutism: Secondary | ICD-10-CM

## 2022-08-12 DIAGNOSIS — E890 Postprocedural hypothyroidism: Secondary | ICD-10-CM

## 2022-08-12 LAB — TSH: TSH: 4.82 u[IU]/mL (ref 0.35–5.50)

## 2022-08-12 NOTE — Patient Instructions (Signed)
[employer] BP monitor

## 2022-08-12 NOTE — Progress Notes (Signed)
Patient ID: Carol Kelley, female   DOB: 12-15-1985, 37 y.o.   MRN: [id number]             Reason for Appointment: Endocrinology follow-up   History of Present Illness:   PROBLEM 1: Hypothyroidism was first diagnosed  at age 53  At the time of diagnosis patient had thyroidectomy for Graves' disease   If she is irregular with her thyroid supplement she starts having symptoms of  fatigue She tends to have long-term cold sensitivity also      The patient has been previously treated with  doses of levothyroxine between 125 and 175 mcg   RECENT history:  She had been on 150 mcg levothyroxine in 2020 At that time she had been diagnosed to have early pregnancy but did not follow-up since she moved out of town She thinks she may have been taking 175 mcg levothyroxine later in pregnancy   More recently has been getting on 50 mcg of thyroxine prescription She was out of her levothyroxine prior to her follow-up in 7/22, then she was complaining of feeling moody and somewhat tired with some cold intolerance TSH was 93  Her last visit was over a year ago She was told to take her medication more regularly and follow-up at the same dose of 150 mcg January 2023 However she did not come back for follow-up She says she is feeling tired and this has been going on for quite some time This is despite her sleeping a little better now Has some cold intolerance  She takes generic levothyroxine supplement  Lately  she has taken her levothyroxine consistently as soon as she wakes up  TSH last month was high at 15.4 from her PCP but no dosage change made  Patient's weight history is as follows:  Wt Readings from Last 3 Encounters:  08/12/22 151 lb (68.5 kg)  07/08/22 146 lb 9.6 oz (66.5 kg)  12/24/21 139 lb (63 kg)    Thyroid function results have been as follows:  Lab Results  Component Value Date   TSH 15.41 (H) 07/08/2022   TSH 17.52 (H) 04/25/2021   TSH 3.11 12/18/2020   TSH 92.98  (H) 10/28/2020   FREET4 0.59 (L) 07/08/2022   FREET4 0.72 04/25/2021   FREET4 1.21 12/18/2020   FREET4 0.42 (L) 10/28/2020     Past Medical History:  Diagnosis Date   Anemia    Graves disease    Hayfever    Pre-eclampsia    Thyroid disease     Past Surgical History:  Procedure Laterality Date   CESAREAN SECTION     x2   THERAPEUTIC ABORTION     THYROIDECTOMY      Family History  Problem Relation Age of Onset   Hypertension Mother    Cancer Father        prostate   Hypertension Maternal Grandmother    Diabetes Maternal Grandfather    Diabetes Paternal Grandfather    Thyroid disease Other     Social History:  reports that she quit smoking about 3 months ago. Her smoking use included cigars. She has never used smokeless tobacco. She reports that she does not currently use alcohol. She reports that she does not currently use drugs after having used the following drugs: Marijuana.  Allergies:  Allergies  Allergen Reactions   Latex     Allergies as of 08/12/2022       Reactions   Latex  Medication List        Accurate as of August 12, 2022 11:47 AM. If you have any questions, ask your nurse or doctor.          STOP taking these medications    metroNIDAZOLE 0.75 % vaginal gel Commonly known as: METROGEL Stopped by: Elayne Snare, MD       TAKE these medications    levothyroxine 150 MCG tablet Commonly known as: SYNTHROID Take 1 tablet (150 mcg total) by mouth daily.   multivitamin tablet Take 1 tablet by mouth daily.   Vitamin D (Ergocalciferol) 1.25 MG (50000 UNIT) Caps capsule Commonly known as: DRISDOL Take 1 capsule (50,000 Units total) by mouth every 7 (seven) days.           Review of Systems   No history of hypertension except during pregnancy Not checking blood pressure at home  Not smoking  BP Readings from Last 3 Encounters:  08/12/22 126/84  07/08/22 136/88  12/24/21 (!) 130/100   History of chronic anemia and  usually cannot take iron supplements because of constipation  Lab Results  Component Value Date   HGB 11.2 (L) 07/08/2022    She has history of hirsutism which is longstanding but she thinks it may be getting worse As this is mostly facial on the upper lip and chin and some on the lower abdomen She does have regular cycles and is not on a BCP Usually treating the hirsutism by plucking daily Free testosterone was normal in 2020 but not clear if they were high in 2016 Also had normal 17 hydroxyprogesterone and prolactin        Examination:    BP 126/84   Pulse 86   Ht 5' (1.524 m)   Wt 151 lb (68.5 kg)   SpO2 99%   BMI 29.49 kg/m      Assessment:  HYPOTHYROIDISM, postsurgical since age 36  She has been on levothyroxine  150 mcg  Has been irregular with her follow-up Complains of fatigue  She does take her levothyroxine regularly now However TSH last month was significantly high at 15.4  No difficulties with insomnia lately  HYPERTENSION: Blood pressure is high normal and now being followed by PCP  Hirsutism: Likely idiopathic with previously normal free testosterone and other labs but will recheck need    PLAN:   Recheck TSH as her labs were done over a month ago  Fasting labs for free testosterone Consider Aldactone if no cause found   Follow-up in 4 months   Amiir Heckard 08/12/2022, 11:47 AM     Note: This office note was prepared with Dragon voice recognition system technology. Any transcriptional errors that result from this process are unintentional.

## 2022-08-14 NOTE — Progress Notes (Signed)
Please call to let patient know that the thyroid level is now nearly normal, she can continue the 150 mcg levothyroxine but take an extra half tablet once a week.  Also needs to come in for her fasting labs for female home evaluation.  To discuss her fatigue with PCP

## 2022-09-26 ENCOUNTER — Other Ambulatory Visit: Payer: Self-pay | Admitting: Endocrinology

## 2022-09-26 DIAGNOSIS — E890 Postprocedural hypothyroidism: Secondary | ICD-10-CM

## 2022-10-15 ENCOUNTER — Other Ambulatory Visit: Payer: BLUE CROSS/BLUE SHIELD

## 2022-10-19 ENCOUNTER — Ambulatory Visit: Payer: BLUE CROSS/BLUE SHIELD | Admitting: Endocrinology

## 2022-11-19 ENCOUNTER — Other Ambulatory Visit: Payer: BLUE CROSS/BLUE SHIELD

## 2022-11-24 ENCOUNTER — Ambulatory Visit: Payer: BLUE CROSS/BLUE SHIELD | Admitting: Endocrinology

## 2023-04-29 ENCOUNTER — Encounter: Payer: Self-pay | Admitting: Nurse Practitioner

## 2023-05-21 ENCOUNTER — Telehealth: Payer: Self-pay | Admitting: Nurse Practitioner

## 2023-05-21 ENCOUNTER — Ambulatory Visit: Payer: BLUE CROSS/BLUE SHIELD | Admitting: Nurse Practitioner

## 2023-05-21 NOTE — Telephone Encounter (Signed)
 Noted.

## 2023-05-21 NOTE — Telephone Encounter (Signed)
 1st no show, letter sent via [facility]

## 2023-05-24 ENCOUNTER — Other Ambulatory Visit: Payer: BLUE CROSS/BLUE SHIELD

## 2023-05-24 ENCOUNTER — Telehealth: Payer: Self-pay

## 2023-05-24 DIAGNOSIS — E890 Postprocedural hypothyroidism: Secondary | ICD-10-CM

## 2023-05-24 NOTE — Telephone Encounter (Signed)
 Orders Placed This Encounter  Procedures   TSH   T4, free

## 2023-05-25 LAB — TSH: TSH: 20.19 m[IU]/L — ABNORMAL HIGH

## 2023-05-25 LAB — T4, FREE: Free T4: 0.9 ng/dL (ref 0.8–1.8)

## 2023-05-28 ENCOUNTER — Ambulatory Visit (INDEPENDENT_AMBULATORY_CARE_PROVIDER_SITE_OTHER): Payer: BLUE CROSS/BLUE SHIELD | Admitting: Endocrinology

## 2023-05-28 ENCOUNTER — Encounter: Payer: Self-pay | Admitting: Endocrinology

## 2023-05-28 VITALS — BP 132/86 | HR 83 | Resp 20 | Ht 60.0 in | Wt 163.4 lb

## 2023-05-28 DIAGNOSIS — Z8639 Personal history of other endocrine, nutritional and metabolic disease: Secondary | ICD-10-CM

## 2023-05-28 DIAGNOSIS — E669 Obesity, unspecified: Secondary | ICD-10-CM

## 2023-05-28 DIAGNOSIS — E890 Postprocedural hypothyroidism: Secondary | ICD-10-CM | POA: Diagnosis not present

## 2023-05-28 DIAGNOSIS — L680 Hirsutism: Secondary | ICD-10-CM

## 2023-05-28 DIAGNOSIS — E559 Vitamin D deficiency, unspecified: Secondary | ICD-10-CM

## 2023-05-28 MED ORDER — LEVOTHYROXINE SODIUM 150 MCG PO TABS
150.0000 ug | ORAL_TABLET | Freq: Every day | ORAL | 3 refills | Status: AC
Start: 2023-05-28 — End: ?

## 2023-05-28 MED ORDER — VITAMIN D (ERGOCALCIFEROL) 1.25 MG (50000 UNIT) PO CAPS: 50000.0000 [IU] | ORAL_CAPSULE | ORAL | 1 refills | Status: DC

## 2023-05-28 NOTE — Progress Notes (Signed)
Outpatient Endocrinology Note [name], MD   Patient's Name: Carol Kelley    DOB: [birth year]    MRN: [id number]  REASON OF VISIT: Follow-up for postsurgical hypothyroidism  PCP: Gerre Scull, NP  HISTORY OF PRESENT ILLNESS:   Carol Kelley is a 38 y.o. old female with past medical history as listed below is presented for a follow up for postsurgical hypothyroidism.  Patient has vitamin D deficiency.  Patient has complaints of facial hair.  Pertinent  History: Patient was previously seen by Dr. Lucianne Muss and was last time seen in April 2024.  Postsurgical hypothyroidism: -Patient underwent total thyroidectomy for Graves' disease at the age of 7 and developed postsurgical hypothyroidism.  Historically that had been compliance issue with taking levothyroxine regularly.  She had taken various doses of levothyroxine from 125 to 175 mcg in the past.  She had taken levothyroxine up to 175 mcg daily during pregnancy.  He had intermittently elevated TSH, related to irregularity of taking levothyroxine and some of the time she had normal thyroid function test.  # Vitamin D deficiency : -Vitamin D was < 7.0 in February 2024, evaluated by primary care provider and is started on ergocalciferol 50,000 IU weekly.  # Hirsutism / Facial hair :  She has history of hirsutism which is longstanding.  Mostly facial on the upper lip and chin and some on the lower abdomen. Thought to be idiopathic.  She does have mostly regular menstrual cycles.  Not on birth control pills.Usually treating the hirsutism by plucking daily.  Endocrinology workup she had normal free and total testosterone in 2020. Had normal 17-hydroxyprogesterone and prolactin in 2017.   Interval history  Patient has been on levothyroxine, she states she has been taking levothyroxine 150 mcg 1 and half tablet daily however she is not fully compliant and taking in a brace every other day.  She complains of fatigue, cold intolerance,  weight gain about 12 pounds in last few months, low mood and depressed mood.  Patient thyroid function test with elevated TSH of 20.  In April she had TSH of 4.82 when she was taking levothyroxine 150 mcg daily.   Latest Reference Range & Units 08/12/22 12:04 05/24/23 11:11  TSH mIU/L 4.82 20.19 (H)  T4,Free(Direct) 0.8 - 1.8 ng/dL  0.9  (H): Data is abnormally high  Patient has severe vitamin D deficiency.  She was prescribed weekly high-dose of vitamin D.  She had taken in the past she still has some leftover, not fully compliant as well.  She works from home and mostly stays indoor.  Patient is concerned about facial hair, she thinks is getting worse lately.  REVIEW OF SYSTEMS:  As per history of present illness.   PAST MEDICAL HISTORY: Past Medical History:  Diagnosis Date   Anemia    Graves disease    Hayfever    Pre-eclampsia    Thyroid disease     PAST SURGICAL HISTORY: Past Surgical History:  Procedure Laterality Date   CESAREAN SECTION     x2   THERAPEUTIC ABORTION     THYROIDECTOMY      ALLERGIES: Allergies  Allergen Reactions   Latex     FAMILY HISTORY:  Family History  Problem Relation Age of Onset   Hypertension Mother    Cancer Father        prostate   Hypertension Maternal Grandmother    Diabetes Maternal Grandfather    Diabetes Paternal Grandfather    Thyroid disease Other  SOCIAL HISTORY: Social History   Socioeconomic History   Marital status: Single    Spouse name: Not on file   Number of children: 2   Years of education: Not on file   Highest education level: Not on file  Occupational History   Not on file  Tobacco Use   Smoking status: Former    Types: Cigars    Quit date: 04/24/2022    Years since quitting: 1.0   Smokeless tobacco: Never  Vaping Use   Vaping status: Never Used  Substance and Sexual Activity   Alcohol use: Not Currently    Comment: occ   Drug use: Not Currently    Types: Marijuana   Sexual activity:  Not Currently    Birth control/protection: None  Other Topics Concern   Not on file  Social History Narrative   Not on file   Social Drivers of Health   Financial Resource Strain: Not on file  Food Insecurity: Not on file  Transportation Needs: Not on file  Physical Activity: Not on file  Stress: Not on file  Social Connections: Not on file    MEDICATIONS:  Current Outpatient Medications  Medication Sig Dispense Refill   Multiple Vitamin (MULTIVITAMIN) tablet Take 1 tablet by mouth daily.     levothyroxine (SYNTHROID) 150 MCG tablet Take 1 tablet (150 mcg total) by mouth daily. 90 tablet 3   Vitamin D, Ergocalciferol, (DRISDOL) 1.25 MG (50000 UNIT) CAPS capsule Take 1 capsule (50,000 Units total) by mouth every 7 (seven) days. 12 capsule 1   No current facility-administered medications for this visit.    PHYSICAL EXAM: Vitals:   05/28/23 0900  BP: 132/86  Pulse: 83  Resp: 20  SpO2: 99%  Weight: 163 lb 6.4 oz (74.1 kg)  Height: 5' (1.524 m)   Body mass index is 31.91 kg/m.  Wt Readings from Last 3 Encounters:  05/28/23 163 lb 6.4 oz (74.1 kg)  08/12/22 151 lb (68.5 kg)  07/08/22 146 lb 9.6 oz (66.5 kg)    General: Well developed, well nourished female in no apparent distress.  HEENT: AT/[place], no external lesions. Hearing intact to the spoken word Eyes: EOMI. No stare, proptosis or lid lag. Conjunctiva clear and no icterus. Neck: Trachea midline, neck supple, anterior neck is scar present. Lungs: Clear to auscultation, no wheeze. Respirations not labored Heart: S1S2, Regular in rate and rhythm. No loud murmurs Abdomen: Soft, non tender, non distended.  Lower abdominal hair present. Neurologic: Alert, oriented, normal speech, deep tendon biceps reflexes delayed,  no gross focal neurological deficit Extremities: No pedal pitting edema, no tremors of outstretched hands Skin: Warm, color good.  Dry skin. Psychiatric: Does not appear depressed or anxious  PERTINENT  HISTORIC LABORATORY AND IMAGING STUDIES:  All pertinent laboratory results were reviewed. Please see HPI also for further details.   TSH  Date Value Ref Range Status  05/24/2023 20.19 (H) mIU/L Final    Comment:              Reference Range .           > or = 20 Years  0.40-4.50 .                Pregnancy Ranges           First trimester    0.26-2.66           Second trimester   0.55-2.73           Third trimester  0.43-2.91   08/12/2022 4.82 0.35 - 5.50 uIU/mL Final  07/08/2022 15.41 (H) 0.35 - 5.50 uIU/mL Final     ASSESSMENT / PLAN  1. Postsurgical hypothyroidism   2. Vitamin D deficiency   3. Hirsutism   4. Obesity (BMI 30-39.9)   5. H/O Graves' disease   6. H/O total thyroidectomy    # Postsurgical hypothyroidism : -Status post total thyroidectomy for Graves' disease at the age of 66. -Currently taking levothyroxine 150 mcg 1 and half tablet an average every other day, not fully compliant. -Recent thyroid function test with elevated TSH of 20, not suppressed on thyroid hormone replacement. -She is clinically hypothyroid.  Most of her symptoms are explained by low level of thyroid hormone.  Plan: -Discussed in detail about the compliance taking every day.  Advised that if she misses she can take 2 tablets the next day.  Provided written instruction. -Will give levothyroxine 150 mcg daily.  Discussed about proper intake.  Discussed about reminder strategies as well. -Check thyroid function test in 6 to 8 weeks.  We discussed the medical need for compliance with levothyroxine therapy, that it is a hormone necessary for life, and that serious consequences may result from noncompliance. Discussed the proper method of levothyroxine administration: take on an empty stomach in the morning, with water, waiting thirty to sixty minutes before taking any other beverages or food. Also reviewed the need to take calcium or iron supplements or multivitamin (that may contain iron or  calcium) at least 4 hours after levothyroxine administration.  # Hirsutism : -Thought to be idiopathic.  She had workup in the past with normal testosterone level and 17 hydroxyprogesterone.  She has mostly regular menstrual cycle.  She had random cortisol checked in the past in January 2021 was 38.6.  Patient stated facial hair has been getting more. -Will check 1 mg overnight dexamethasone suppression test for hypercortisolism.  She has no other clinical features of Cushing syndrome. -Will recheck testosterone level, free and total. -Consider Aldactone if no cause found.  # Vitamin D deficiency -Continue vitamin D ergocalciferol 50,000 international unit weekly.  Medication renewed.  Will recheck lab in next set of blood work.  Patient will complete lab in the morning fasting few days prior to follow-up visit.  Diagnoses and all orders for this visit:  Postsurgical hypothyroidism -     levothyroxine (SYNTHROID) 150 MCG tablet; Take 1 tablet (150 mcg total) by mouth daily. -     T4, free -     TSH  Vitamin D deficiency -     VITAMIN D 25 Hydroxy (Vit-D Deficiency, Fractures)  Hirsutism -     Cortisol -     Dexamethasone, blood -     Testosterone , Free and Total  Obesity (BMI 30-39.9) -     Cortisol -     Dexamethasone, blood  H/O Graves' disease  H/O total thyroidectomy  Other orders -     Vitamin D, Ergocalciferol, (DRISDOL) 1.25 MG (50000 UNIT) CAPS capsule; Take 1 capsule (50,000 Units total) by mouth every 7 (seven) days.    DISPOSITION Follow up in clinic in 2 months suggested.  All questions answered and patient verbalized understanding of the plan.  [place] Keierra Nudo, MD [facility] [address] [place], [address] [place], [place] [address] Phone # [phone]  At least part of this note was generated using voice recognition software. Inadvertent word errors may have occurred, which were not recognized during the proofreading  process.

## 2023-05-28 NOTE — Patient Instructions (Signed)
1 mg dexamethasone overnight suppression test. Instruction: Take 1 mg dexamethasone tablet at 11 PM ( timing is important) and have blood work for cortisol test in the next morning at 8 AM with fasting (timing is important).   Take levothyroxine 150 mcg daily.  Take Vit D 50K weekly, I renewed medications.   Labs few days prior to follow up visit, fasting in early morning.    Things to know about Levothyroxine:   1.  The ideal time to take levothyroxine is on an empty stomach in the morning approximately thirty to sixty minutes before any food or milk products.  However, if it is easier to remember at bedtime, etc. that's okay as long as it's on an empty stomach ie after about four hours after big meal.    2.  Ideally, also, you shouldn't take other medications at the same time.  We do know that certain medications will interfere with the absorption of your Levothyroxine: Iron pills, calcium or milk products, multi vitamins, and stomach or acid medications.  Just take them a few hours 3 to 4 hours apart.  3.  Each brand seems to have variations compared to other brands, so a problem can be to switch brands (sometimes happens with getting a generic), because we base your dose on your blood tests.  No other problems with that generics and Brand name medication costs more.   4.  It is best to get in the habit of taking the levothyroxine daily.  However, this is a medication that you make up missed doses.  That is, if you forget to take it for a day or two, you take all of the missed doses as soon as you remember.   5.  There are no "side effects" to thyroid hormone - is identical to your body's hormone, only symptoms of over and under treatment.  Carol Alyzabeth Pontillo, MD [facility] [address] [place], [address] [place], [place] [address] Phone # [phone]

## 2023-07-08 NOTE — Progress Notes (Signed)
 BP 124/84 (BP Location: Right Arm, Patient Position: Sitting, Cuff Size: Normal)   Pulse 60   Temp (!) 97.3 F (36.3 C)   Ht 5' (1.524 m)   Wt 159 lb 6.4 oz (72.3 kg)   LMP 07/11/2023 (Exact Date)   SpO2 100%   Breastfeeding No   BMI 31.13 kg/m    Subjective:    Patient ID: Carol Kelley, female    DOB: Jun 06, 1985, 38 y.o.   MRN: [id number]  CC: Chief Complaint  Patient presents with   Annual Exam    Concerns with bump lower left eye    HPI: Carol Kelley is a 38 y.o. female presenting on 07/12/2023 for comprehensive medical examination. Current medical complaints include: bump under her left eye  She notes a bump under her left eye that has been there for 1 year. She states that it has gotten smaller recently. She denies pain, itching, and drainage. She has a skin tag next to it that has been there for most of her life.   She currently lives with: children Menopausal Symptoms: no  Depression and Anxiety Screen done today and results listed below:     07/12/2023    9:33 AM 07/08/2022    2:08 PM  Depression screen PHQ 2/9  Decreased Interest 1 2  Down, Depressed, Hopeless 2 2  PHQ - 2 Score 3 4  Altered sleeping 1 2  Tired, decreased energy 1 3  Change in appetite 0 0  Feeling bad or failure about yourself  0 1  Trouble concentrating 1 2  Moving slowly or fidgety/restless 0 0  Suicidal thoughts 0 0  PHQ-9 Score 6 12  Difficult doing work/chores Somewhat difficult Somewhat difficult      07/12/2023    9:34 AM 07/08/2022    2:08 PM  GAD 7 : Generalized Anxiety Score  Nervous, Anxious, on Edge 1 3  Control/stop worrying 1 2  Worry too much - different things 1 3  Trouble relaxing 1 1  Restless 1 1  Easily annoyed or irritable 2 3  Afraid - awful might happen 1 0  Total GAD 7 Score 8 13  Anxiety Difficulty Somewhat difficult Somewhat difficult    The patient does not have a history of falls. I did not complete a risk assessment for falls. A plan of care for  falls was not documented.   Past Medical History:  Past Medical History:  Diagnosis Date   Anemia    Graves disease    Hayfever    Pre-eclampsia    Thyroid disease     Surgical History:  Past Surgical History:  Procedure Laterality Date   CESAREAN SECTION     x2   THERAPEUTIC ABORTION     THYROIDECTOMY      Medications:  Current Outpatient Medications on File Prior to Visit  Medication Sig   levothyroxine (SYNTHROID) 150 MCG tablet Take 1 tablet (150 mcg total) by mouth daily.   Multiple Vitamin (MULTIVITAMIN) tablet Take 1 tablet by mouth daily. (Patient not taking: Reported on 07/12/2023)   No current facility-administered medications on file prior to visit.    Allergies:  Allergies  Allergen Reactions   Latex     Social History:  Social History   Socioeconomic History   Marital status: Single    Spouse name: Not on file   Number of children: 2   Years of education: Not on file   Highest education level: Not on file  Occupational  History   Not on file  Tobacco Use   Smoking status: Former    Types: Cigars    Quit date: 04/24/2022    Years since quitting: 1.2   Smokeless tobacco: Never  Vaping Use   Vaping status: Never Used  Substance and Sexual Activity   Alcohol use: Not Currently    Comment: occ   Drug use: Not Currently    Types: Marijuana   Sexual activity: Not Currently    Birth control/protection: None  Other Topics Concern   Not on file  Social History Narrative   Not on file   Social Drivers of Health   Financial Resource Strain: Not on file  Food Insecurity: Not on file  Transportation Needs: Not on file  Physical Activity: Not on file  Stress: Not on file  Social Connections: Not on file  Intimate Partner Violence: Not on file   Social History   Tobacco Use  Smoking Status Former   Types: Cigars   Quit date: 04/24/2022   Years since quitting: 1.2  Smokeless Tobacco Never   Social History   Substance and Sexual  Activity  Alcohol Use Not Currently   Comment: occ    Family History:  Family History  Problem Relation Age of Onset   Hypertension Mother    Cancer Father        prostate   Hypertension Maternal Grandmother    Diabetes Maternal Grandfather    Diabetes Paternal Grandfather    Thyroid disease Other     Past medical history, surgical history, medications, allergies, family history and social history reviewed with patient today and changes made to appropriate areas of the chart.   Review of Systems  Constitutional: Negative.   HENT: Negative.    Eyes: Negative.   Respiratory: Negative.    Cardiovascular: Negative.   Gastrointestinal: Negative.   Genitourinary: Negative.   Musculoskeletal: Negative.   Skin:        Bump under left eye  Neurological: Negative.   Psychiatric/Behavioral: Negative.     All other ROS negative except what is listed above and in the HPI.      Objective:    BP 124/84 (BP Location: Right Arm, Patient Position: Sitting, Cuff Size: Normal)   Pulse 60   Temp (!) 97.3 F (36.3 C)   Ht 5' (1.524 m)   Wt 159 lb 6.4 oz (72.3 kg)   LMP 07/11/2023 (Exact Date)   SpO2 100%   Breastfeeding No   BMI 31.13 kg/m   Wt Readings from Last 3 Encounters:  07/12/23 159 lb 6.4 oz (72.3 kg)  05/28/23 163 lb 6.4 oz (74.1 kg)  08/12/22 151 lb (68.5 kg)    Physical Exam Vitals and nursing note reviewed.  Constitutional:      General: She is not in acute distress.    Appearance: Normal appearance.  HENT:     Head: Normocephalic and atraumatic.     Right Ear: Tympanic membrane, ear canal and external ear normal.     Left Ear: Tympanic membrane, ear canal and external ear normal.  Eyes:     Conjunctiva/sclera: Conjunctivae normal.  Cardiovascular:     Rate and Rhythm: Normal rate and regular rhythm.     Pulses: Normal pulses.     Heart sounds: Normal heart sounds.  Pulmonary:     Effort: Pulmonary effort is normal.     Breath sounds: Normal breath  sounds.  Abdominal:     Palpations: Abdomen is soft.  Tenderness: There is no abdominal tenderness.  Musculoskeletal:        General: Normal range of motion.     Cervical back: Normal range of motion and neck supple.     Right lower leg: No edema.     Left lower leg: No edema.  Lymphadenopathy:     Cervical: No cervical adenopathy.  Skin:    General: Skin is warm and dry.     Comments: Nodule under left eye, no signs of infection, drainage  Neurological:     General: No focal deficit present.     Mental Status: She is alert and oriented to person, place, and time.     Cranial Nerves: No cranial nerve deficit.     Coordination: Coordination normal.     Gait: Gait normal.  Psychiatric:        Mood and Affect: Mood normal.        Behavior: Behavior normal.        Thought Content: Thought content normal.        Judgment: Judgment normal.     Results for orders placed or performed in visit on 05/24/23  TSH   Collection Time: 05/24/23 11:11 AM  Result Value Ref Range   TSH 20.19 (H) mIU/L  T4, free   Collection Time: 05/24/23 11:11 AM  Result Value Ref Range   Free T4 0.9 0.8 - 1.8 ng/dL      Assessment & Plan:   Problem List Items Addressed This Visit       Endocrine   Postsurgical hypothyroidism (Chronic)   Chronic, ongoing.  She is currently following with Dr. Lucianne Muss with endocrinology.  She is taking levothyroxine 150 mcg daily.  She has an appointment in 2 weeks to recheck thyroid function.         Other   Anxiety and depression   Chronic, stable. Her symptoms have improved since starting therapy. Continue with this and follow-up with worsening symptoms or any concerns.       Vitamin D deficiency   Chronic, ongoing. She has been following with endocrinology and has labs in 2 weeks.       Routine general medical examination at a health care facility - Primary   Health maintenance reviewed and updated. Discussed nutrition, exercise. Check CMP, CBC today.  Follow-up 1 year.        Other Visit Diagnoses       Skin nodule       Small, pearly lesion under left eye. Will place referral to dermatology.   Relevant Orders   Ambulatory referral to Dermatology        Follow up plan: No follow-ups on file.   LABORATORY TESTING:  - Pap smear: up to date  IMMUNIZATIONS:   - Tdap: Tetanus vaccination status reviewed: last tetanus booster within 10 years. - Influenza: Declined - Pneumovax: Not applicable - Prevnar: Not applicable - HPV: Not applicable - Shingrix vaccine: Not applicable  SCREENING: -Mammogram: Not applicable  - Colonoscopy: Not applicable  - Bone Density: Not applicable   PATIENT COUNSELING:   Advised to take 1 mg of folate supplement per day if capable of pregnancy.   Sexuality: Discussed sexually transmitted diseases, partner selection, use of condoms, avoidance of unintended pregnancy  and contraceptive alternatives.   Advised to avoid cigarette smoking.  I discussed with the patient that most people either abstain from alcohol or drink within safe limits (<=14/week and <=4 drinks/occasion for males, <=7/weeks and <= 3 drinks/occasion for females) and  that the risk for alcohol disorders and other health effects rises proportionally with the number of drinks per week and how often a drinker exceeds daily limits.  Discussed cessation/primary prevention of drug use and availability of treatment for abuse.   Diet: Encouraged to adjust caloric intake to maintain  or achieve ideal body weight, to reduce intake of dietary saturated fat and total fat, to limit sodium intake by avoiding high sodium foods and not adding table salt, and to maintain adequate dietary potassium and calcium preferably from fresh fruits, vegetables, and low-fat dairy products.    stressed the importance of regular exercise  Injury prevention: Discussed safety belts, safety helmets, smoke detector, smoking near bedding or upholstery.   Dental  health: Discussed importance of regular tooth brushing, flossing, and dental visits.    NEXT PREVENTATIVE PHYSICAL DUE IN 1 YEAR. No follow-ups on file.  Larnce Schnackenberg A Artie Takayama

## 2023-07-12 ENCOUNTER — Encounter: Payer: Self-pay | Admitting: Nurse Practitioner

## 2023-07-12 ENCOUNTER — Ambulatory Visit (INDEPENDENT_AMBULATORY_CARE_PROVIDER_SITE_OTHER): Payer: BLUE CROSS/BLUE SHIELD | Admitting: Nurse Practitioner

## 2023-07-12 VITALS — BP 124/84 | HR 60 | Temp 97.3°F | Ht 60.0 in | Wt 159.4 lb

## 2023-07-12 DIAGNOSIS — F32A Depression, unspecified: Secondary | ICD-10-CM

## 2023-07-12 DIAGNOSIS — E890 Postprocedural hypothyroidism: Secondary | ICD-10-CM

## 2023-07-12 DIAGNOSIS — F419 Anxiety disorder, unspecified: Secondary | ICD-10-CM

## 2023-07-12 DIAGNOSIS — Z0000 Encounter for general adult medical examination without abnormal findings: Secondary | ICD-10-CM | POA: Diagnosis not present

## 2023-07-12 DIAGNOSIS — E559 Vitamin D deficiency, unspecified: Secondary | ICD-10-CM | POA: Diagnosis not present

## 2023-07-12 DIAGNOSIS — R229 Localized swelling, mass and lump, unspecified: Secondary | ICD-10-CM

## 2023-07-12 NOTE — Assessment & Plan Note (Signed)
 Chronic, stable. Her symptoms have improved since starting therapy. Continue with this and follow-up with worsening symptoms or any concerns.

## 2023-07-12 NOTE — Assessment & Plan Note (Signed)
 Chronic, ongoing.  She is currently following with Dr. Lucianne Muss with endocrinology.  She is taking levothyroxine 150 mcg daily.  She has an appointment in 2 weeks to recheck thyroid function.

## 2023-07-12 NOTE — Assessment & Plan Note (Signed)
Health maintenance reviewed and updated. Discussed nutrition, exercise. Check CMP, CBC today. Follow-up 1 year.   

## 2023-07-12 NOTE — Assessment & Plan Note (Signed)
 Chronic, ongoing. She has been following with endocrinology and has labs in 2 weeks.

## 2023-07-12 NOTE — Patient Instructions (Signed)
 It was great to see you!  Keep up the great work!   I am placing a referral to dermatology, they will call to schedule   Let's follow-up in 1 year, sooner if you have concerns.  If a referral was placed today, you will be contacted for an appointment. Please note that routine referrals can sometimes take up to 3-4 weeks to process. Please call our office if you haven't heard anything after this time frame.  Take care,  Rodman Pickle, NP]

## 2023-07-15 ENCOUNTER — Other Ambulatory Visit: Payer: Self-pay

## 2023-07-27 ENCOUNTER — Other Ambulatory Visit: Payer: BLUE CROSS/BLUE SHIELD

## 2023-07-27 DIAGNOSIS — J301 Allergic rhinitis due to pollen: Secondary | ICD-10-CM | POA: Insufficient documentation

## 2023-07-27 DIAGNOSIS — Z8759 Personal history of other complications of pregnancy, childbirth and the puerperium: Secondary | ICD-10-CM | POA: Insufficient documentation

## 2023-07-27 DIAGNOSIS — E0500 Thyrotoxicosis with diffuse goiter without thyrotoxic crisis or storm: Secondary | ICD-10-CM | POA: Insufficient documentation

## 2023-07-27 DIAGNOSIS — I10 Essential (primary) hypertension: Secondary | ICD-10-CM | POA: Insufficient documentation

## 2023-07-29 ENCOUNTER — Encounter: Payer: Self-pay | Admitting: Endocrinology

## 2023-07-30 ENCOUNTER — Encounter: Payer: Self-pay | Admitting: Endocrinology

## 2023-07-30 ENCOUNTER — Telehealth: Payer: Self-pay | Admitting: Endocrinology

## 2023-07-30 ENCOUNTER — Other Ambulatory Visit: Payer: Self-pay

## 2023-07-30 ENCOUNTER — Ambulatory Visit: Payer: BLUE CROSS/BLUE SHIELD | Admitting: Endocrinology

## 2023-07-30 VITALS — BP 140/80 | HR 67 | Resp 20 | Ht 60.0 in | Wt 162.6 lb

## 2023-07-30 DIAGNOSIS — E559 Vitamin D deficiency, unspecified: Secondary | ICD-10-CM | POA: Diagnosis not present

## 2023-07-30 DIAGNOSIS — L680 Hirsutism: Secondary | ICD-10-CM

## 2023-07-30 DIAGNOSIS — E669 Obesity, unspecified: Secondary | ICD-10-CM | POA: Diagnosis not present

## 2023-07-30 DIAGNOSIS — E890 Postprocedural hypothyroidism: Secondary | ICD-10-CM | POA: Diagnosis not present

## 2023-07-30 DIAGNOSIS — Z8639 Personal history of other endocrine, nutritional and metabolic disease: Secondary | ICD-10-CM

## 2023-07-30 MED ORDER — DEXAMETHASONE 1 MG PO TABS: ORAL_TABLET | ORAL | 0 refills | Status: DC

## 2023-07-30 NOTE — Progress Notes (Signed)
 Outpatient Endocrinology Note Carol Margaretmary Prisk, MD   Patient's Name: Carol Kelley    DOB: [birth year]    MRN: [id number]  REASON OF VISIT: Follow-up for postsurgical hypothyroidism  PCP: Gerre Scull, NP  HISTORY OF PRESENT ILLNESS:   Carol Kelley is a 38 y.o. old female with past medical history as listed below is presented for a follow up for postsurgical hypothyroidism / vitamin D deficiency / facial hair /hirsutism.  Pertinent  History: Patient was previously seen by Dr. Lucianne Muss and was last time seen in April 2024.  Postsurgical hypothyroidism: -Patient underwent total thyroidectomy for Graves' disease at the age of 7 and developed postsurgical hypothyroidism.  Historically that had been compliance issue with taking levothyroxine regularly.  She had taken various doses of levothyroxine from 125 to 175 mcg in the past.  She had taken levothyroxine up to 175 mcg daily during pregnancy.  He had intermittently elevated TSH, related to irregularity of taking levothyroxine and some of the time she had normal thyroid function test.  Levothyroxine was maintained on 150 mcg daily in January 2025.  She has normal thyroid function test on this dose with TSH of 2.36 in July 27, 2023.  # Vitamin D deficiency : -Vitamin D was < 7.0 in February 2024, evaluated by primary care provider and was treated on ergocalciferol 50,000 IU weekly.  Vitamin D level normal of 48 in March 2025.  # Hirsutism / Facial hair :  She has history of hirsutism which is longstanding.  Mostly facial on the upper lip and chin and some on the lower abdomen. Thought to be idiopathic.  She does have mostly regular menstrual cycles.  Not on birth control pills.Usually treating the hirsutism by plucking daily.  Endocrinology workup she had normal free and total testosterone in 2020. Had normal 17-hydroxyprogesterone and prolactin in 2017.   Interval history She is currently taking levothyroxine 150 mcg daily, reports  compliance.  Denies palpitation or heat intolerance.  She has normal thyroid function test on this dose recently.  Patient reports she has been feeling better improvement on fatigue after being on a stable dose of levothyroxine.  Vitamin D has normalized.  She has been taking vitamin D2 ergocalciferol 50,000 international unit weekly.  Workup for hirsutism morning cortisol level 9.6 reassuring.  This test was planned with 1 mg dexamethasone suppression test however she did not get dexamethasone and did not take dexamethasone prior to the cortisol test.  Testosterone level awaited.  She continued to have facial hair.  She also had seen facial lesion with dermatology and has establish care with dermatology as well.  No other complaints today.   Latest Reference Range & Units 07/27/23 09:01  Vitamin D, 25-Hydroxy 30 - 100 ng/mL 48    Latest Reference Range & Units 07/27/23 09:01  Cortisol, Plasma mcg/dL 9.6  TSH mIU/L 9.14  N8,GNFA(OZHYQM) 0.8 - 1.8 ng/dL 1.3    REVIEW OF SYSTEMS:  As per history of present illness.   PAST MEDICAL HISTORY: Past Medical History:  Diagnosis Date   Anemia    Graves disease    Hayfever    Pre-eclampsia    Thyroid disease     PAST SURGICAL HISTORY: Past Surgical History:  Procedure Laterality Date   CESAREAN SECTION     x2   THERAPEUTIC ABORTION     THYROIDECTOMY      ALLERGIES: Allergies  Allergen Reactions   Latex     FAMILY HISTORY:  Family History  Problem  Relation Age of Onset   Hypertension Mother    Cancer Father        prostate   Hypertension Maternal Grandmother    Diabetes Maternal Grandfather    Diabetes Paternal Grandfather    Thyroid disease Other     SOCIAL HISTORY: Social History   Socioeconomic History   Marital status: Single    Spouse name: Not on file   Number of children: 2   Years of education: Not on file   Highest education level: Not on file  Occupational History   Not on file  Tobacco Use    Smoking status: Former    Types: Cigars    Quit date: 04/24/2022    Years since quitting: 1.2   Smokeless tobacco: Never  Vaping Use   Vaping status: Never Used  Substance and Sexual Activity   Alcohol use: Not Currently    Comment: occ   Drug use: Not Currently    Types: Marijuana   Sexual activity: Not Currently    Birth control/protection: None  Other Topics Concern   Not on file  Social History Narrative   Not on file   Social Drivers of Health   Financial Resource Strain: Not on file  Food Insecurity: Not on file  Transportation Needs: Not on file  Physical Activity: Not on file  Stress: Not on file  Social Connections: Not on file    MEDICATIONS:  Current Outpatient Medications  Medication Sig Dispense Refill   dexamethasone (DECADRON) 1 MG tablet Take 1 tablet by mouth once at 11 pm and have lab for cortisol next morning at 8 AM. 1 tablet 0   levothyroxine (SYNTHROID) 150 MCG tablet Take 1 tablet (150 mcg total) by mouth daily. 90 tablet 3   Multiple Vitamin (MULTIVITAMIN) tablet Take 1 tablet by mouth daily. (Patient not taking: Reported on 07/12/2023)     No current facility-administered medications for this visit.    PHYSICAL EXAM: Vitals:   07/30/23 1032 07/30/23 1033  BP: (!) 140/80 (!) 140/80  Pulse: 67   Resp: 20   SpO2: 99%   Weight: 162 lb 9.6 oz (73.8 kg)   Height: 5' (1.524 m)     Body mass index is 31.76 kg/m.  Wt Readings from Last 3 Encounters:  07/30/23 162 lb 9.6 oz (73.8 kg)  07/12/23 159 lb 6.4 oz (72.3 kg)  05/28/23 163 lb 6.4 oz (74.1 kg)    General: Well developed, well nourished female in no apparent distress.  HEENT: AT/[place], no external lesions. Hearing intact to the spoken word Eyes: EOMI. No stare, proptosis or lid lag. Conjunctiva clear and no icterus. Neck: Trachea midline, neck supple, anterior neck is scar present. Lungs: Clear to auscultation, no wheeze. Respirations not labored Heart: S1S2, Regular in rate and rhythm. No  loud murmurs Abdomen: Soft, non tender, non distended.  Lower abdominal hair present. Neurologic: Alert, oriented, normal speech, deep tendon biceps reflexes delayed,  no gross focal neurological deficit Extremities: No pedal pitting edema, no tremors of outstretched hands Skin: Warm, color good.  Dry skin. Psychiatric: Does not appear depressed or anxious  PERTINENT HISTORIC LABORATORY AND IMAGING STUDIES:  All pertinent laboratory results were reviewed. Please see HPI also for further details.   TSH  Date Value Ref Range Status  07/27/2023 2.36 mIU/L Final    Comment:              Reference Range .           >  or = 20 Years  0.40-4.50 .                Pregnancy Ranges           First trimester    0.26-2.66           Second trimester   0.55-2.73           Third trimester    0.43-2.91   05/24/2023 20.19 (H) mIU/L Final    Comment:              Reference Range .           > or = 20 Years  0.40-4.50 .                Pregnancy Ranges           First trimester    0.26-2.66           Second trimester   0.55-2.73           Third trimester    0.43-2.91   08/12/2022 4.82 0.35 - 5.50 uIU/mL Final     ASSESSMENT / PLAN  1. Postsurgical hypothyroidism   2. Hirsutism   3. Obesity (BMI 30-39.9)   4. Vitamin D deficiency   5. H/O Graves' disease   6. H/O total thyroidectomy     # Postsurgical hypothyroidism : -Status post total thyroidectomy for Graves' disease at the age of 89. -Currently taking levothyroxine 150 mcg daily.  Dose was adjusted in January 2025.  Compliance had been the issue in the past. -Recent thyroid function test normal, continue current dose of levothyroxine.  Plan: -Check thyroid function test prior to follow-up visit in 4 months.  Continue current dose of levothyroxine 150 mcg daily.  # Hirsutism : -Thought to be idiopathic.  She had workup in the past with normal testosterone level and 17 hydroxyprogesterone.  She has mostly regular menstrual cycle.   She had random cortisol checked in the past in January 2021 was 38.6.  Patient stated facial hair has been getting more. -Cortisol in the morning 9.6 this time normal. -I will plan to do 1 mg dexamethasone overnight suppression test however she did not take the dexamethasone medication prior to the test. -Follow-up testosterone level result. -Will plan for 1 mg overnight dexamethasone suppression test for hypercortisolism.  She has no other clinical features of Cushing syndrome. -Consider Aldactone if no cause found.  Patient is asked to mention with her dermatology as well regarding facial hair and treatment.  # Vitamin D deficiency -Recent vitamin D level normal.  Patient is advised to take over-the-counter vitamin D3 2000 international unit daily.   Diagnoses and all orders for this visit:  Postsurgical hypothyroidism  Hirsutism -     dexamethasone (DECADRON) 1 MG tablet; Take 1 tablet by mouth once at 11 pm and have lab for cortisol next morning at 8 AM. -     Cortisol -     Dexamethasone, blood  Obesity (BMI 30-39.9) -     dexamethasone (DECADRON) 1 MG tablet; Take 1 tablet by mouth once at 11 pm and have lab for cortisol next morning at 8 AM. -     Cortisol -     Dexamethasone, blood  Vitamin D deficiency  H/O Graves' disease  H/O total thyroidectomy   DISPOSITION Follow up in clinic in 4 months suggested.  Patient will complete lab for cortisol for 1 mg dexamethasone suppression test next week and thyroid lab prior  to follow-up visit in 4 months.  All questions answered and patient verbalized understanding of the plan.  Carol Teriann Livingood, MD [facility] Endocr[facility] [address] [place], [address] [place], [place] [address] Phone # [phone]  At least part of this note was generated using voice recognition software. Inadvertent word errors may have occurred, which were not recognized during the proofreading process.

## 2023-07-30 NOTE — Patient Instructions (Addendum)
 Take Vit D3 2000 Units over the counter daily.  1 mg dexamethasone overnight suppression test. Instruction: Take 1 mg dexamethasone tablet at 11 PM ( timing is important) and have blood work for cortisol test in the next morning at 8 AM with fasting (timing is important).

## 2023-07-30 NOTE — Telephone Encounter (Signed)
 Patients pharmacy of choice is the   [id number] - [place], [place] - [address]  Please resend dexamethasone (DECADRON) 1 MG tablet

## 2023-08-04 ENCOUNTER — Other Ambulatory Visit

## 2023-08-06 ENCOUNTER — Encounter: Payer: Self-pay | Admitting: Endocrinology

## 2023-08-07 LAB — DEXAMETHASONE, BLOOD: Dexamethasone, Serum: <20 ng/dL

## 2023-08-07 LAB — TSH: TSH: 2.36 m[IU]/L

## 2023-08-07 LAB — VITAMIN D 25 HYDROXY (VIT D DEFICIENCY, FRACTURES): Vit D, 25-Hydroxy: 48 ng/mL (ref 30–100)

## 2023-08-07 LAB — CORTISOL: Cortisol, Plasma: 9.6 ug/dL

## 2023-08-07 LAB — TESTOSTERONE, FREE & TOTAL
Free Testosterone: 2.3 pg/mL (ref 0.1–6.4)
Testosterone, Total, LC-MS-MS: 38 ng/dL (ref 2–45)

## 2023-08-07 LAB — T4, FREE: Free T4: 1.3 ng/dL (ref 0.8–1.8)

## 2023-08-13 LAB — CORTISOL: Cortisol, Plasma: 1.3 ug/dL — ABNORMAL LOW

## 2023-08-13 LAB — DEXAMETHASONE, BLOOD: Dexamethasone, Serum: 544 ng/dL

## 2023-12-01 ENCOUNTER — Telehealth: Payer: Self-pay

## 2023-12-01 NOTE — Telephone Encounter (Signed)
 Please cancel lab appt for 12/06/23. There are no orders in here and not sure what he wants to order since her labs came back normal.  I am also sending this message to Dr. Mercie for when he returns so he can review her chart and place the orders.

## 2023-12-06 ENCOUNTER — Other Ambulatory Visit

## 2023-12-10 ENCOUNTER — Ambulatory Visit: Admitting: Endocrinology

## 2024-01-25 ENCOUNTER — Ambulatory Visit: Admitting: Endocrinology

## 2024-06-16 ENCOUNTER — Other Ambulatory Visit

## 2024-06-16 ENCOUNTER — Encounter: Payer: Self-pay | Admitting: Endocrinology

## 2024-06-16 ENCOUNTER — Ambulatory Visit: Admitting: Endocrinology

## 2024-06-16 VITALS — BP 140/78 | HR 97 | Ht 61.0 in | Wt 172.8 lb

## 2024-06-16 DIAGNOSIS — Z9089 Acquired absence of other organs: Secondary | ICD-10-CM

## 2024-06-16 DIAGNOSIS — E890 Postprocedural hypothyroidism: Secondary | ICD-10-CM

## 2024-06-16 DIAGNOSIS — E559 Vitamin D deficiency, unspecified: Secondary | ICD-10-CM

## 2024-06-16 DIAGNOSIS — Z8639 Personal history of other endocrine, nutritional and metabolic disease: Secondary | ICD-10-CM

## 2024-06-16 LAB — T4, FREE: Free T4: 1.6 ng/dL (ref 0.8–1.8)

## 2024-06-16 LAB — TSH: TSH: 0.4 m[IU]/L

## 2024-06-16 LAB — VITAMIN D 25 HYDROXY (VIT D DEFICIENCY, FRACTURES): Vit D, 25-Hydroxy: 28 ng/mL — ABNORMAL LOW (ref 30–100)

## 2024-06-16 NOTE — Progress Notes (Signed)
 "  Outpatient Endocrinology Note Jahmil Macleod, MD   Patient's Name: Carol Kelley    DOB: 1985-07-11    MRN: [id number]  REASON OF VISIT: Follow-up for postsurgical hypothyroidism  PCP: Nedra Tinnie LABOR, NP  HISTORY OF PRESENT ILLNESS:   Carol Kelley is a 39 y.o. old female with past medical history as listed below is presented for a follow up for postsurgical hypothyroidism / vitamin D  deficiency.  Pertinent  History: Patient was previously seen by Dr. Von and was last time seen in April 2024.  Postsurgical hypothyroidism: -Patient underwent total thyroidectomy for Graves' disease at the age of 7 and developed postsurgical hypothyroidism.  Historically that had been compliance issue with taking levothyroxine  regularly.  She had taken various doses of levothyroxine  from 125 to 175 mcg in the past.  She had taken levothyroxine  up to 175 mcg daily during pregnancy.  He had intermittently elevated TSH, related to irregularity of taking levothyroxine  and some of the time she had normal thyroid  function test.  Levothyroxine  was maintained on 150 mcg daily in January 2025.  She has normal thyroid  function test on this dose with TSH of 2.36 in July 27, 2023.  # Vitamin D  deficiency : -Vitamin D  was < 7.0 in February 2024, evaluated by primary care provider and was treated on ergocalciferol  50,000 IU weekly.  Vitamin D  level normal of 48 in March 2025.  # Hirsutism / Facial hair :  She has history of hirsutism which is longstanding.  Mostly facial on the upper lip and chin and some on the lower abdomen. Thought to be idiopathic.  She does have mostly regular menstrual cycles.  Not on birth control pills.Usually treating the hirsutism by plucking daily.  Endocrinology workup she had normal free and total testosterone  in 2020. Had normal 17-hydroxyprogesterone and prolactin in 2017.  She had normal testosterone .  1 mg dexamethasone  suppression test was normal with cortisol show of 1.3 in  March 2025.  She has been following with dermatology.   Interval history Patient has been currently taking levothyroxine  150 mcg daily.  Patient reports her plan was recently switched due to recall.  However does do the same.  Patient reports occasional fluttering.  No heat intolerance.  She gained some weight from last year.  She reports compliance with taking levothyroxine  every day.  This time she has not been taking vitamin D  supplement.  No other complaints today.  REVIEW OF SYSTEMS:  As per history of present illness.   PAST MEDICAL HISTORY: Past Medical History:  Diagnosis Date   Anemia    Graves disease    Hayfever    Pre-eclampsia    Thyroid  disease     PAST SURGICAL HISTORY: Past Surgical History:  Procedure Laterality Date   CESAREAN SECTION     x2   THERAPEUTIC ABORTION     THYROIDECTOMY      ALLERGIES: Allergies  Allergen Reactions   Latex     FAMILY HISTORY:  Family History  Problem Relation Age of Onset   Hypertension Mother    Cancer Father        prostate   Hypertension Maternal Grandmother    Diabetes Maternal Grandfather    Diabetes Paternal Grandfather    Thyroid  disease Other     SOCIAL HISTORY: Social History   Socioeconomic History   Marital status: Single    Spouse name: Not on file   Number of children: 2   Years of education: Not on file  Highest education level: Not on file  Occupational History   Not on file  Tobacco Use   Smoking status: Former    Types: Cigars    Quit date: 04/24/2022    Years since quitting: 2.1   Smokeless tobacco: Never  Vaping Use   Vaping status: Never Used  Substance and Sexual Activity   Alcohol use: Not Currently    Comment: occ   Drug use: Not Currently    Types: Marijuana   Sexual activity: Not Currently    Birth control/protection: None  Other Topics Concern   Not on file  Social History Narrative   Not on file   Social Drivers of Health   Tobacco Use: Medium Risk (06/16/2024)    Patient History    Smoking Tobacco Use: Former    Smokeless Tobacco Use: Never    Passive Exposure: Not on Actuary Strain: Not on file  Food Insecurity: Not on file  Transportation Needs: Not on file  Physical Activity: Not on file  Stress: Not on file  Social Connections: Not on file  Depression (PHQ2-9): Medium Risk (07/12/2023)   Depression (PHQ2-9)    PHQ-2 Score: 6  Alcohol Screen: Not on file  Housing: Not on file  Utilities: Not on file  Health Literacy: Not on file    MEDICATIONS:  Current Outpatient Medications  Medication Sig Dispense Refill   levothyroxine  (SYNTHROID ) 150 MCG tablet Take 1 tablet (150 mcg total) by mouth daily. 90 tablet 3   Multiple Vitamin (MULTIVITAMIN) tablet Take 1 tablet by mouth daily. (Patient not taking: Reported on 06/16/2024)     No current facility-administered medications for this visit.    PHYSICAL EXAM: Vitals:   06/16/24 1028  BP: (!) 140/78  Pulse: 97  SpO2: 99%  Weight: 172 lb 12.8 oz (78.4 kg)  Height: 5' 1 (1.549 m)     Body mass index is 32.65 kg/m.  Wt Readings from Last 3 Encounters:  06/16/24 172 lb 12.8 oz (78.4 kg)  07/30/23 162 lb 9.6 oz (73.8 kg)  07/12/23 159 lb 6.4 oz (72.3 kg)    General: Well developed, well nourished female in no apparent distress.  HEENT: AT/[place], no external lesions. Hearing intact to the spoken word Eyes: EOMI. No stare, proptosis or lid lag. Conjunctiva clear and no icterus. Neck: Trachea midline, neck supple, anterior neck is scar present. Lungs: Clear to auscultation, no wheeze. Respirations not labored Heart: S1S2, Regular in rate and rhythm. No loud murmurs Abdomen: Soft, non tender, non distended.  Lower abdominal hair present. Neurologic: Alert, oriented, normal speech, deep tendon biceps reflexes delayed,  no gross focal neurological deficit Extremities: No pedal pitting edema, no tremors of outstretched hands Skin: Warm, color good.   Psychiatric: Does not  appear depressed or anxious  PERTINENT HISTORIC LABORATORY AND IMAGING STUDIES:  All pertinent laboratory results were reviewed. Please see HPI also for further details.   TSH  Date Value Ref Range Status  07/27/2023 2.36 mIU/L Final    Comment:              Reference Range .           > or = 20 Years  0.40-4.50 .                Pregnancy Ranges           First trimester    0.26-2.66           Second trimester   0.55-2.73  Third trimester    0.43-2.91   05/24/2023 20.19 (H) mIU/L Final    Comment:              Reference Range .           > or = 20 Years  0.40-4.50 .                Pregnancy Ranges           First trimester    0.26-2.66           Second trimester   0.55-2.73           Third trimester    0.43-2.91   08/12/2022 4.82 0.35 - 5.50 uIU/mL Final    Latest Reference Range & Units 07/27/23 09:01  Free Testosterone  0.1 - 6.4 pg/mL 2.3  Testosterone , Total, LC-MS-MS 2 - 45 ng/dL 38    ASSESSMENT / PLAN  1. Postsurgical hypothyroidism   2. H/O Graves' disease   3. Vitamin D  deficiency   4. H/O total thyroidectomy     # Postsurgical hypothyroidism : -Status post total thyroidectomy for Graves' disease at the age of 78. -Currently taking levothyroxine  150 mcg daily. Compliance had been the issue in the past.  Plan: -Check thyroid  function test today.  And will adjust the dose of levothyroxine  as needed.  She needs refill.  # Vitamin D  deficiency - Check vitamin D  level today.  She is currently not taking vitamin D  supplement.  Opaline was seen today for hypothyroidism.  Diagnoses and all orders for this visit:  Postsurgical hypothyroidism -     T4, free -     TSH  H/O Graves' disease  Vitamin D  deficiency -     VITAMIN D  25 Hydroxy (Vit-D Deficiency, Fractures)  H/O total thyroidectomy    DISPOSITION Follow up in clinic in 6 months suggested.  Labs today and prior to follow-up visit.  All questions answered and patient verbalized  understanding of the plan.  Hasnain Manheim, MD [facility] Endocrinology [facility] [address] [place], [address] [place], [place] [address] Phone # [phone]  At least part of this note was generated using voice recognition software. Inadvertent word errors may have occurred, which were not recognized during the proofreading process.    "

## 2024-10-16 ENCOUNTER — Other Ambulatory Visit

## 2024-10-19 ENCOUNTER — Ambulatory Visit: Admitting: Endocrinology
